# Patient Record
Sex: Female | Born: 1950 | Race: Black or African American | Hispanic: No | Marital: Single | Smoking: Former smoker
Health system: Southern US, Community
[De-identification: ages and names within clinical notes are randomized; demographics above are authoritative.]

## PROBLEM LIST (undated history)

## (undated) DIAGNOSIS — C801 Malignant (primary) neoplasm, unspecified: Secondary | ICD-10-CM

## (undated) DIAGNOSIS — Z803 Family history of malignant neoplasm of breast: Secondary | ICD-10-CM

## (undated) DIAGNOSIS — K625 Hemorrhage of anus and rectum: Secondary | ICD-10-CM

## (undated) DIAGNOSIS — K219 Gastro-esophageal reflux disease without esophagitis: Secondary | ICD-10-CM

## (undated) DIAGNOSIS — A630 Anogenital (venereal) warts: Secondary | ICD-10-CM

## (undated) DIAGNOSIS — Z800 Family history of malignant neoplasm of digestive organs: Secondary | ICD-10-CM

## (undated) DIAGNOSIS — I10 Essential (primary) hypertension: Secondary | ICD-10-CM

## (undated) DIAGNOSIS — R61 Generalized hyperhidrosis: Secondary | ICD-10-CM

## (undated) HISTORY — DX: Generalized hyperhidrosis: R61

## (undated) HISTORY — DX: Gastro-esophageal reflux disease without esophagitis: K21.9

## (undated) HISTORY — DX: Hemorrhage of anus and rectum: K62.5

## (undated) HISTORY — PX: OSTOMY TAKE DOWN: SHX2142

## (undated) HISTORY — PX: PORT-A-CATH REMOVAL: SHX5289

## (undated) HISTORY — DX: Anogenital (venereal) warts: A63.0

## (undated) HISTORY — DX: Family history of malignant neoplasm of digestive organs: Z80.0

## (undated) HISTORY — PX: RECTAL SURGERY: SHX760

## (undated) HISTORY — DX: Family history of malignant neoplasm of breast: Z80.3

## (undated) HISTORY — PX: LIVER SURGERY: SHX698

## (undated) HISTORY — DX: Malignant (primary) neoplasm, unspecified: C80.1

---

## 2003-03-14 DIAGNOSIS — C801 Malignant (primary) neoplasm, unspecified: Secondary | ICD-10-CM

## 2003-03-14 HISTORY — DX: Malignant (primary) neoplasm, unspecified: C80.1

## 2004-02-16 ENCOUNTER — Ambulatory Visit (HOSPITAL_COMMUNITY): Admission: RE | Admit: 2004-02-16 | Discharge: 2004-02-16 | Payer: Self-pay | Admitting: Obstetrics

## 2004-05-19 ENCOUNTER — Ambulatory Visit: Payer: Self-pay | Admitting: Cardiovascular Disease

## 2004-05-19 ENCOUNTER — Ambulatory Visit: Payer: Self-pay | Admitting: Critical Care Medicine

## 2004-05-19 ENCOUNTER — Inpatient Hospital Stay (HOSPITAL_COMMUNITY): Admission: EM | Admit: 2004-05-19 | Discharge: 2004-05-31 | Payer: Self-pay | Admitting: Family Medicine

## 2004-05-19 ENCOUNTER — Ambulatory Visit: Payer: Self-pay | Admitting: Infectious Diseases

## 2004-05-20 ENCOUNTER — Encounter (INDEPENDENT_AMBULATORY_CARE_PROVIDER_SITE_OTHER): Payer: Self-pay | Admitting: Specialist

## 2004-05-20 ENCOUNTER — Encounter: Payer: Self-pay | Admitting: Cardiology

## 2004-05-24 ENCOUNTER — Encounter (INDEPENDENT_AMBULATORY_CARE_PROVIDER_SITE_OTHER): Payer: Self-pay | Admitting: Specialist

## 2004-05-24 ENCOUNTER — Encounter (INDEPENDENT_AMBULATORY_CARE_PROVIDER_SITE_OTHER): Payer: Self-pay | Admitting: *Deleted

## 2004-06-08 ENCOUNTER — Encounter: Admission: RE | Admit: 2004-06-08 | Discharge: 2004-06-08 | Payer: Self-pay | Admitting: Thoracic Surgery

## 2004-06-17 ENCOUNTER — Ambulatory Visit: Payer: Self-pay | Admitting: Infectious Diseases

## 2004-08-10 ENCOUNTER — Encounter: Admission: RE | Admit: 2004-08-10 | Discharge: 2004-08-10 | Payer: Self-pay | Admitting: Thoracic Surgery

## 2004-09-16 ENCOUNTER — Ambulatory Visit: Payer: Self-pay | Admitting: Infectious Diseases

## 2005-12-25 ENCOUNTER — Ambulatory Visit (HOSPITAL_COMMUNITY): Admission: RE | Admit: 2005-12-25 | Discharge: 2005-12-25 | Payer: Self-pay | Admitting: Gastroenterology

## 2005-12-25 ENCOUNTER — Encounter (INDEPENDENT_AMBULATORY_CARE_PROVIDER_SITE_OTHER): Payer: Self-pay | Admitting: *Deleted

## 2005-12-26 ENCOUNTER — Ambulatory Visit (HOSPITAL_COMMUNITY): Admission: RE | Admit: 2005-12-26 | Discharge: 2005-12-26 | Payer: Self-pay | Admitting: Gastroenterology

## 2006-01-05 ENCOUNTER — Ambulatory Visit: Payer: Self-pay | Admitting: Hematology and Oncology

## 2006-01-18 LAB — CEA: CEA: 4.7 ng/mL (ref 0.0–5.0)

## 2006-01-18 LAB — CBC WITH DIFFERENTIAL/PLATELET
Eosinophils Absolute: 0.4 10*3/uL (ref 0.0–0.5)
LYMPH%: 15.4 % (ref 14.0–48.0)
MCV: 92.6 fL (ref 81.0–101.0)
MONO%: 5.4 % (ref 0.0–13.0)
NEUT#: 4.2 10*3/uL (ref 1.5–6.5)
Platelets: 248 10*3/uL (ref 145–400)
RBC: 4.41 10*6/uL (ref 3.70–5.32)

## 2006-01-18 LAB — COMPREHENSIVE METABOLIC PANEL
ALT: 11 U/L (ref 0–35)
BUN: 6 mg/dL (ref 6–23)
CO2: 26 mEq/L (ref 19–32)
Creatinine, Ser: 0.76 mg/dL (ref 0.40–1.20)
Glucose, Bld: 81 mg/dL (ref 70–99)
Total Bilirubin: 0.7 mg/dL (ref 0.3–1.2)

## 2006-01-19 LAB — HIV ANTIBODY (ROUTINE TESTING W REFLEX)

## 2006-01-22 ENCOUNTER — Ambulatory Visit: Admission: RE | Admit: 2006-01-22 | Discharge: 2006-04-05 | Payer: Self-pay | Admitting: Radiation Oncology

## 2006-01-23 ENCOUNTER — Ambulatory Visit (HOSPITAL_COMMUNITY): Admission: RE | Admit: 2006-01-23 | Discharge: 2006-01-23 | Payer: Self-pay | Admitting: Hematology and Oncology

## 2006-02-06 LAB — CBC WITH DIFFERENTIAL/PLATELET
Basophils Absolute: 0.1 10*3/uL (ref 0.0–0.1)
Eosinophils Absolute: 0.6 10*3/uL — ABNORMAL HIGH (ref 0.0–0.5)
HCT: 39.9 % (ref 34.8–46.6)
HGB: 13.7 g/dL (ref 11.6–15.9)
MONO#: 0.4 10*3/uL (ref 0.1–0.9)
NEUT#: 3.6 10*3/uL (ref 1.5–6.5)
NEUT%: 63.6 % (ref 39.6–76.8)
WBC: 5.6 10*3/uL (ref 3.9–10.0)
lymph#: 1 10*3/uL (ref 0.9–3.3)

## 2006-02-08 ENCOUNTER — Ambulatory Visit (HOSPITAL_COMMUNITY): Admission: RE | Admit: 2006-02-08 | Discharge: 2006-02-08 | Payer: Self-pay | Admitting: General Surgery

## 2006-02-12 LAB — CBC WITH DIFFERENTIAL/PLATELET
Basophils Absolute: 0.1 10*3/uL (ref 0.0–0.1)
Eosinophils Absolute: 0.6 10*3/uL — ABNORMAL HIGH (ref 0.0–0.5)
HCT: 38.1 % (ref 34.8–46.6)
HGB: 13.4 g/dL (ref 11.6–15.9)
LYMPH%: 15.4 % (ref 14.0–48.0)
MONO#: 0.6 10*3/uL (ref 0.1–0.9)
NEUT#: 4.6 10*3/uL (ref 1.5–6.5)
NEUT%: 67 % (ref 39.6–76.8)
Platelets: 207 10*3/uL (ref 145–400)
RBC: 4.22 10*6/uL (ref 3.70–5.32)
WBC: 6.9 10*3/uL (ref 3.9–10.0)

## 2006-02-15 LAB — CBC WITH DIFFERENTIAL/PLATELET
BASO%: 0.1 % (ref 0.0–2.0)
HCT: 41 % (ref 34.8–46.6)
LYMPH%: 6.2 % — ABNORMAL LOW (ref 14.0–48.0)
MCHC: 34.6 g/dL (ref 32.0–36.0)
MONO#: 0.1 10*3/uL (ref 0.1–0.9)
NEUT%: 89.5 % — ABNORMAL HIGH (ref 39.6–76.8)
Platelets: 236 10*3/uL (ref 145–400)
WBC: 6.5 10*3/uL (ref 3.9–10.0)

## 2006-02-19 LAB — CBC WITH DIFFERENTIAL/PLATELET
BASO%: 1.1 % (ref 0.0–2.0)
EOS%: 2.8 % (ref 0.0–7.0)
HCT: 37.7 % (ref 34.8–46.6)
LYMPH%: 9.3 % — ABNORMAL LOW (ref 14.0–48.0)
MCH: 30.6 pg (ref 26.0–34.0)
MCHC: 33.7 g/dL (ref 32.0–36.0)
NEUT%: 80.1 % — ABNORMAL HIGH (ref 39.6–76.8)
Platelets: 250 10*3/uL (ref 145–400)
lymph#: 0.4 10*3/uL — ABNORMAL LOW (ref 0.9–3.3)

## 2006-02-20 LAB — CBC WITH DIFFERENTIAL/PLATELET
BASO%: 1.1 % (ref 0.0–2.0)
EOS%: 2.5 % (ref 0.0–7.0)
MCH: 31.4 pg (ref 26.0–34.0)
MCHC: 34.3 g/dL (ref 32.0–36.0)
RBC: 4.26 10*6/uL (ref 3.70–5.32)
RDW: 10.5 % — ABNORMAL LOW (ref 11.3–14.5)
lymph#: 0.3 10*3/uL — ABNORMAL LOW (ref 0.9–3.3)

## 2006-02-21 ENCOUNTER — Ambulatory Visit: Payer: Self-pay | Admitting: Hematology and Oncology

## 2006-02-26 LAB — CBC WITH DIFFERENTIAL/PLATELET
Basophils Absolute: 0 10*3/uL (ref 0.0–0.1)
Eosinophils Absolute: 0.2 10*3/uL (ref 0.0–0.5)
HCT: 38 % (ref 34.8–46.6)
HGB: 13.6 g/dL (ref 11.6–15.9)
LYMPH%: 6.6 % — ABNORMAL LOW (ref 14.0–48.0)
MCV: 89.6 fL (ref 81.0–101.0)
MONO%: 4 % (ref 0.0–13.0)
NEUT#: 4.4 10*3/uL (ref 1.5–6.5)
Platelets: 217 10*3/uL (ref 145–400)

## 2006-03-02 ENCOUNTER — Ambulatory Visit (HOSPITAL_COMMUNITY): Admission: RE | Admit: 2006-03-02 | Discharge: 2006-03-02 | Payer: Self-pay | Admitting: Hematology and Oncology

## 2006-03-05 LAB — URINALYSIS, MICROSCOPIC - CHCC
Glucose: NEGATIVE g/dL
Nitrite: NEGATIVE
RBC count: NEGATIVE (ref 0–2)
Specific Gravity, Urine: 1.005 (ref 1.003–1.035)

## 2006-03-05 LAB — BASIC METABOLIC PANEL
Calcium: 8.8 mg/dL (ref 8.4–10.5)
Creatinine, Ser: 0.8 mg/dL (ref 0.40–1.20)
Glucose, Bld: 103 mg/dL — ABNORMAL HIGH (ref 70–99)
Sodium: 139 mEq/L (ref 135–145)

## 2006-03-05 LAB — CBC WITH DIFFERENTIAL/PLATELET
Eosinophils Absolute: 0.2 10*3/uL (ref 0.0–0.5)
LYMPH%: 4.8 % — ABNORMAL LOW (ref 14.0–48.0)
MCHC: 36 g/dL (ref 32.0–36.0)
MCV: 89.6 fL (ref 81.0–101.0)
MONO%: 8.4 % (ref 0.0–13.0)
Platelets: 217 10*3/uL (ref 145–400)
RBC: 3.96 10*6/uL (ref 3.70–5.32)

## 2006-03-12 LAB — BASIC METABOLIC PANEL
BUN: 11 mg/dL (ref 6–23)
Calcium: 9 mg/dL (ref 8.4–10.5)
Creatinine, Ser: 0.74 mg/dL (ref 0.40–1.20)
Glucose, Bld: 106 mg/dL — ABNORMAL HIGH (ref 70–99)
Sodium: 144 mEq/L (ref 135–145)

## 2006-03-19 LAB — CBC WITH DIFFERENTIAL/PLATELET
Basophils Absolute: 0 10*3/uL (ref 0.0–0.1)
Eosinophils Absolute: 0.1 10*3/uL (ref 0.0–0.5)
HCT: 32.5 % — ABNORMAL LOW (ref 34.8–46.6)
HGB: 12 g/dL (ref 11.6–15.9)
LYMPH%: 1.4 % — ABNORMAL LOW (ref 14.0–48.0)
MCV: 92 fL (ref 81.0–101.0)
MONO#: 0.7 10*3/uL (ref 0.1–0.9)
MONO%: 9.9 % (ref 0.0–13.0)
NEUT#: 5.8 10*3/uL (ref 1.5–6.5)
NEUT%: 87.1 % — ABNORMAL HIGH (ref 39.6–76.8)
Platelets: 276 10*3/uL (ref 145–400)
RBC: 3.53 10*6/uL — ABNORMAL LOW (ref 3.70–5.32)

## 2006-03-20 LAB — URINALYSIS, MICROSCOPIC - CHCC
Nitrite: NEGATIVE
Protein: 30 mg/dL
Specific Gravity, Urine: 1.015 (ref 1.003–1.035)
pH: 6.5 (ref 4.6–8.0)

## 2006-03-22 LAB — CBC WITH DIFFERENTIAL/PLATELET
Basophils Absolute: 0 10*3/uL (ref 0.0–0.1)
Eosinophils Absolute: 0.1 10*3/uL (ref 0.0–0.5)
HGB: 11.8 g/dL (ref 11.6–15.9)
LYMPH%: 1.5 % — ABNORMAL LOW (ref 14.0–48.0)
MCV: 92.2 fL (ref 81.0–101.0)
MONO#: 0.4 10*3/uL (ref 0.1–0.9)
MONO%: 9.3 % (ref 0.0–13.0)
NEUT#: 3.8 10*3/uL (ref 1.5–6.5)
Platelets: 308 10*3/uL (ref 145–400)
RBC: 3.5 10*6/uL — ABNORMAL LOW (ref 3.70–5.32)
WBC: 4.5 10*3/uL (ref 3.9–10.0)

## 2006-03-22 LAB — COMPREHENSIVE METABOLIC PANEL
Albumin: 4.3 g/dL (ref 3.5–5.2)
Alkaline Phosphatase: 66 U/L (ref 39–117)
BUN: 8 mg/dL (ref 6–23)
CO2: 25 mEq/L (ref 19–32)
Glucose, Bld: 93 mg/dL (ref 70–99)
Potassium: 3.4 mEq/L — ABNORMAL LOW (ref 3.5–5.3)
Sodium: 142 mEq/L (ref 135–145)
Total Bilirubin: 0.7 mg/dL (ref 0.3–1.2)
Total Protein: 7.3 g/dL (ref 6.0–8.3)

## 2006-05-02 ENCOUNTER — Inpatient Hospital Stay (HOSPITAL_COMMUNITY): Admission: RE | Admit: 2006-05-02 | Discharge: 2006-05-09 | Payer: Self-pay | Admitting: General Surgery

## 2006-05-02 ENCOUNTER — Encounter (INDEPENDENT_AMBULATORY_CARE_PROVIDER_SITE_OTHER): Payer: Self-pay | Admitting: Specialist

## 2006-05-14 ENCOUNTER — Ambulatory Visit: Admission: RE | Admit: 2006-05-14 | Discharge: 2006-05-14 | Payer: Self-pay | Admitting: General Surgery

## 2006-05-17 ENCOUNTER — Encounter (HOSPITAL_COMMUNITY): Admission: RE | Admit: 2006-05-17 | Discharge: 2006-05-17 | Payer: Self-pay | Admitting: General Surgery

## 2006-05-31 IMAGING — CR DG CHEST 2V
2 series · 2 of 2 positions shown · non-contrast
Comparison: none

CLINICAL DATA: Short of breath.   Cough.
 CHEST - 2 VIEW: 
 No comparison. 
 There is a moderately large right pleural effusion occupying one/half of the right hemithorax.  There is compressive atelectasis in the right lower lobe.  There is no midline shift.  There is no heart failure.  The left lung is clear and there is no effusion on the left.

[view not recorded (1 of 2)]
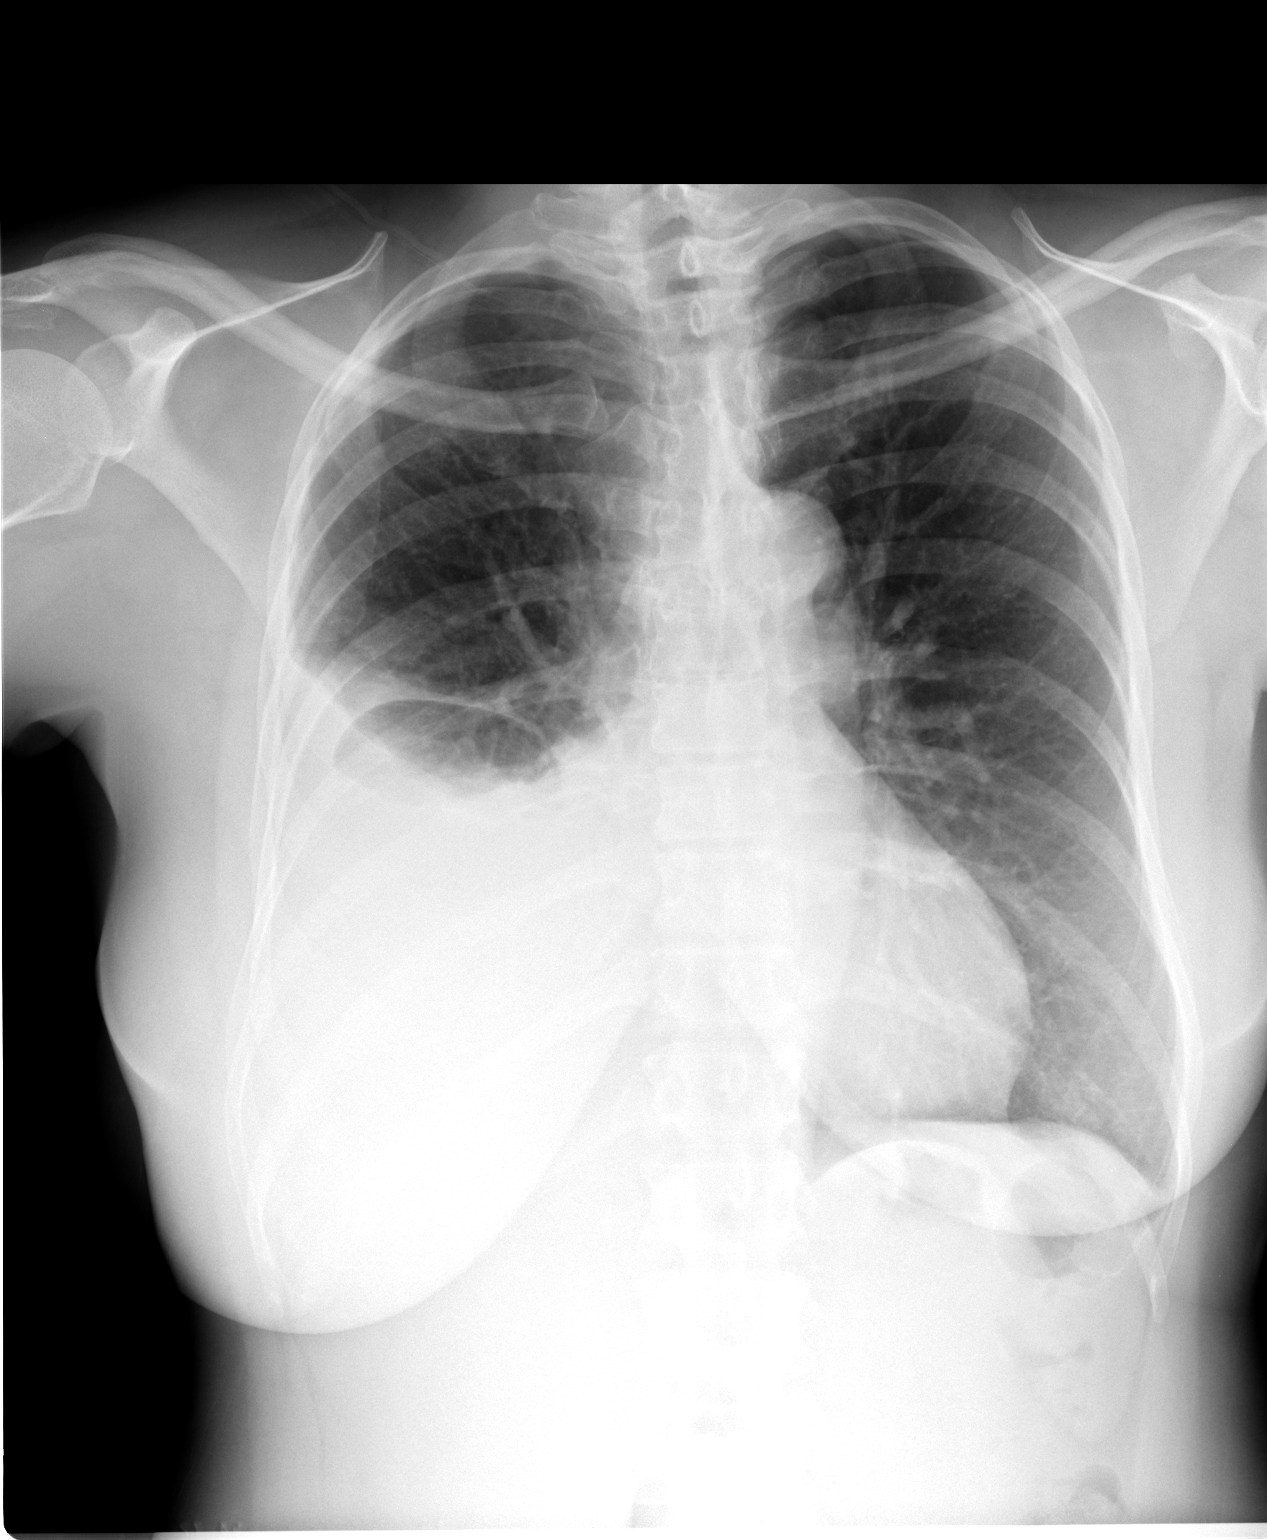

[view not recorded (2 of 2)]
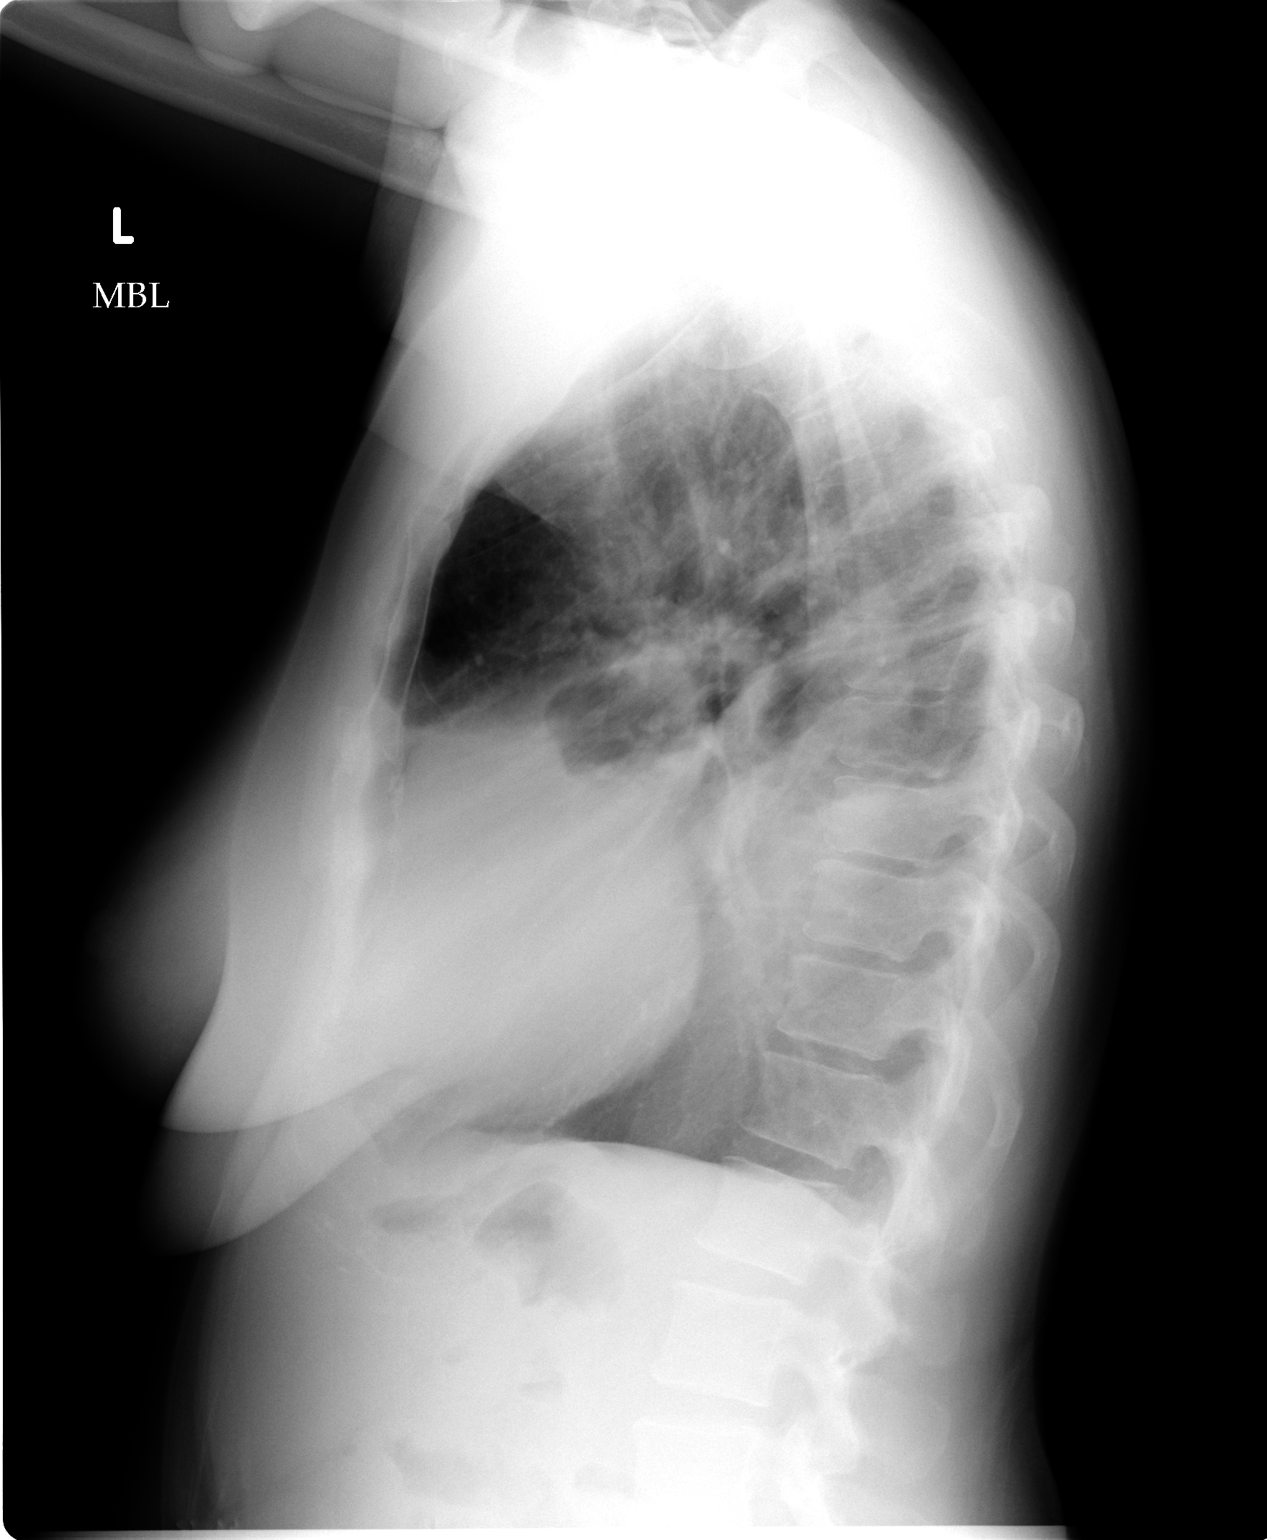

[2 of 2 positions shown; findings below may reference images not displayed]

IMPRESSION: Moderately large right pleural effusion.  No prior studies.  This is concerning for infection or neoplasm and thoracentesis is suggested.

## 2006-06-01 IMAGING — CT CT PELVIS W/ CM
3 series · 15 of 32 positions shown, 18 images · IV contrast (agent unspecified)
Comparison: None.

CLINICAL DATA: Patient having right chest pain for two weeks.  Shortness of breath.  Question infection versus neoplasm.  History of breast cancer.
TECHNIQUE: Contiguous 5 mm axial images of the chest, abdomen and pelvis were obtained after administration of oral and 100 cc of nonionic intravenous contrast.  
CT CHEST WITH CONTRAST:
Review of the patient?s chest x-ray from 05/19/04 demonstrates a unilateral right effusion.  
CT scanning today confirms a moderate to large free flowing right pleural effusion.  This is associated with right middle and lower lobe collapse.  Enhancing tissue is seen along the anterior pleura near the base and a 9 X 15 mm anterior juxtadiaphragmatic lymph node is noted.  There is no axillary, mediastinal or left hilar lymphadenopathy.  Central left lung collapse near the hilum could obscure right hilar adenopathy.
The heart size is normal without pericardial effusion.  There is no left pleural effusion.  Lung windows demonstrate right middle and lower lobe collapse.  There is central alveolar disease in the right upper lobe.  The left lung is clear.

[Series 2: chest/abd/pelvis · axial · 0.79mm/px · z∈[-531,-102]mm · 8 of 155 slices shown]
[im 17/155  soft-tissue]
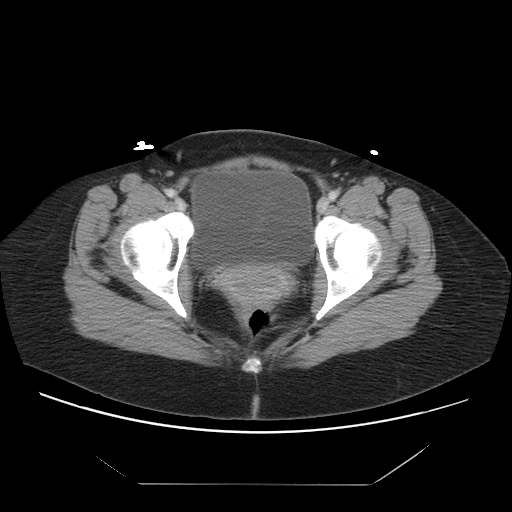
[im 33/155  soft-tissue]
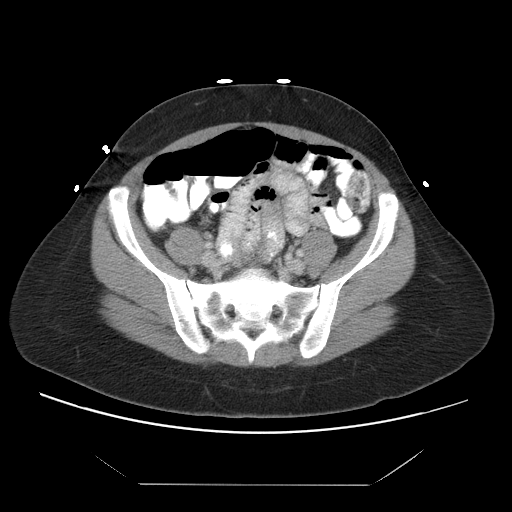
[im 49/155  soft-tissue]
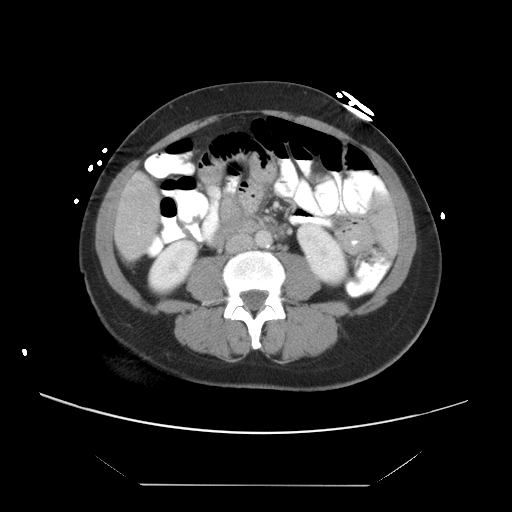
[im 65/155  soft-tissue]
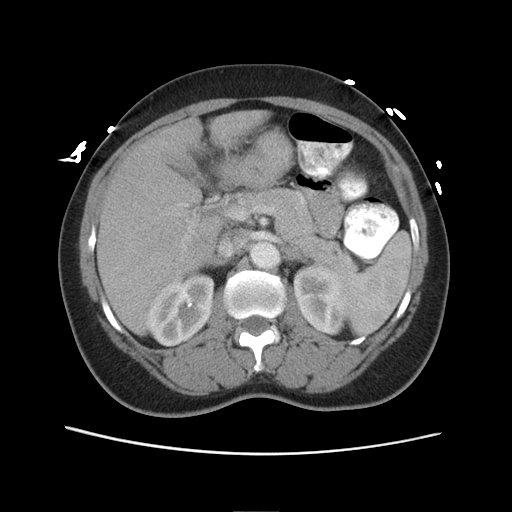
[im 90/155  soft-tissue]
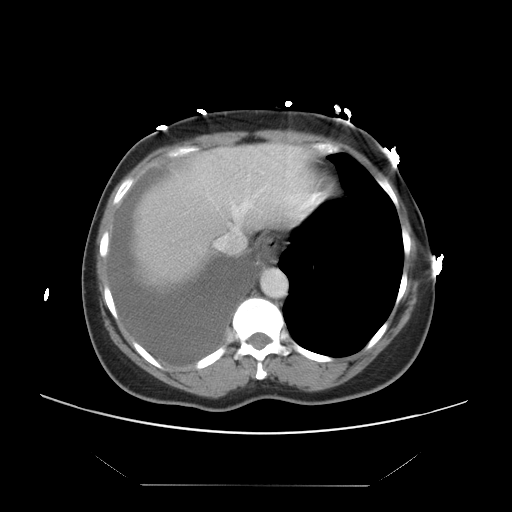
[im 106/155  soft-tissue]
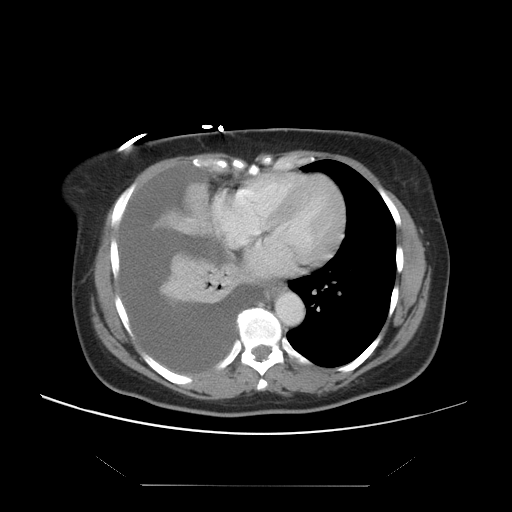
[im 122/155  soft-tissue]
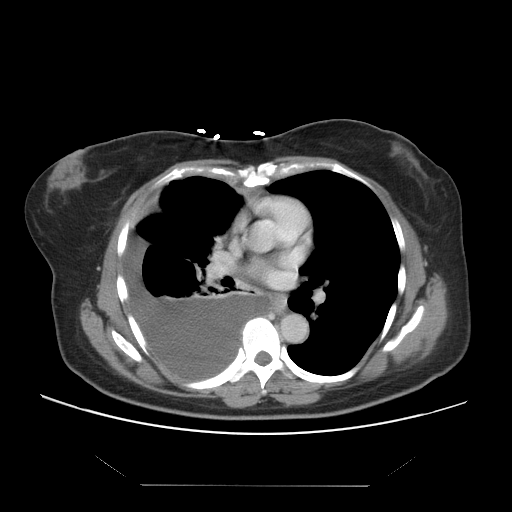
[im 138/155  soft-tissue]
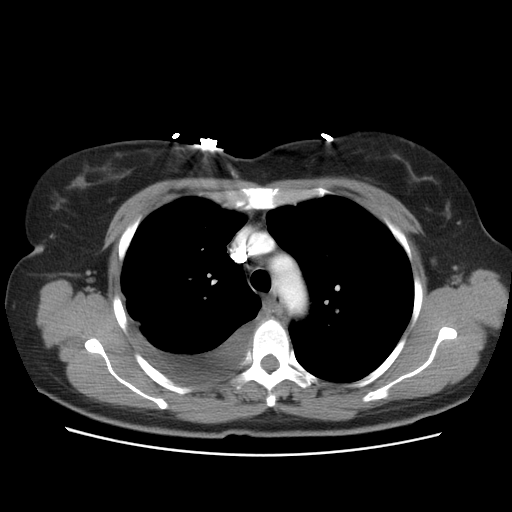

[Series 3: recon 2: chest/abd/pelvis · axial · 0.70mm/px · z∈[-272,-102]mm · 5 of 60 slices shown]
[im 9/60  soft-tissue]
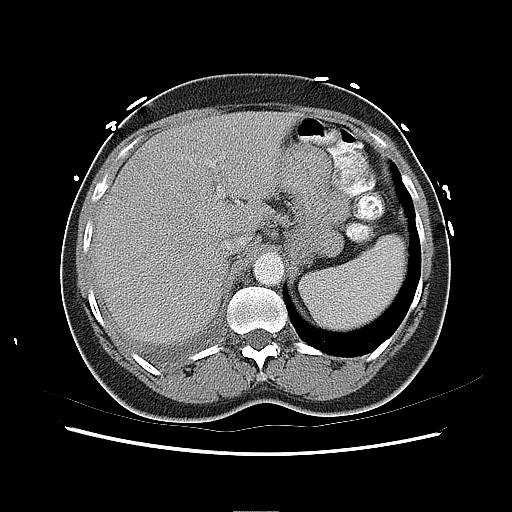
[im 17/60  soft-tissue]
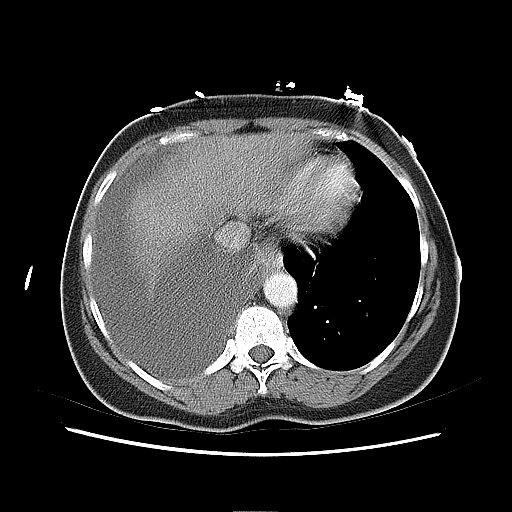
[im 26/60  soft-tissue]
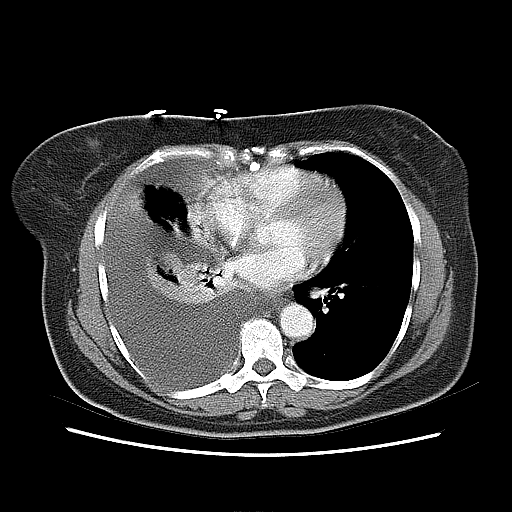
[im 34/60  soft-tissue]
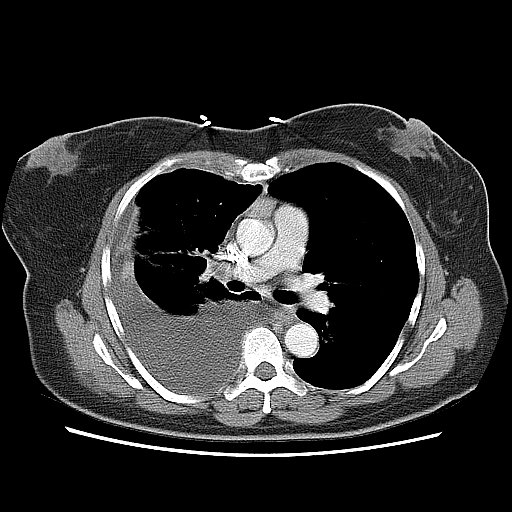
[im 43/60  soft-tissue]
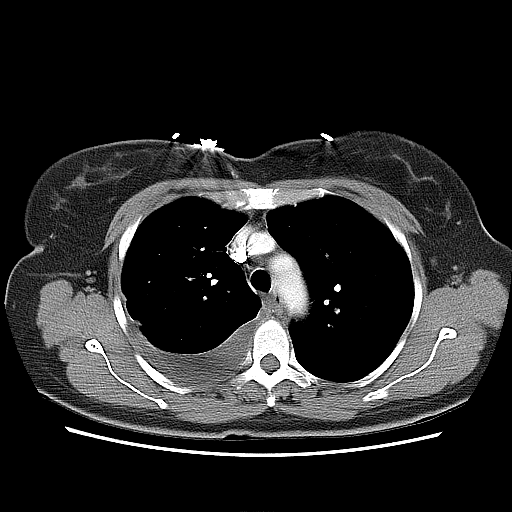

[Series 4: renal delays · axial · 0.70mm/px · z∈[-396,-341]mm · 2 of 35 slices shown, 5 images]
[im 12/35  soft-tissue]
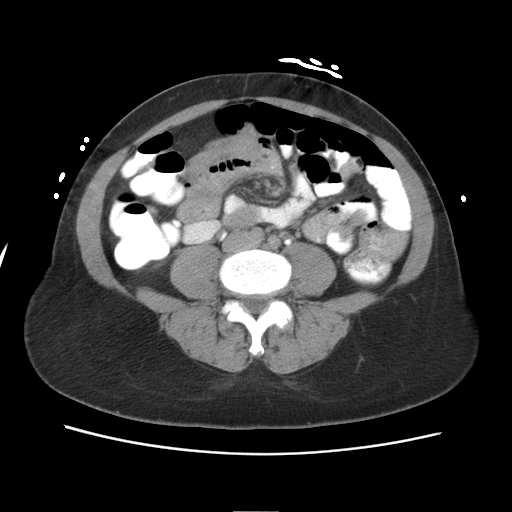
[im 12/35  lung]
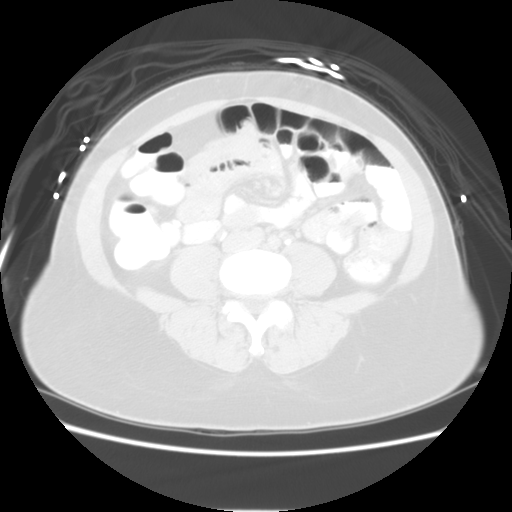
[im 12/35  bone]
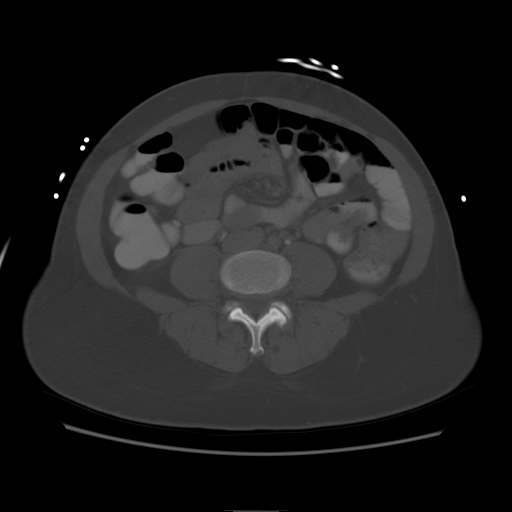
[im 23/35  soft-tissue]
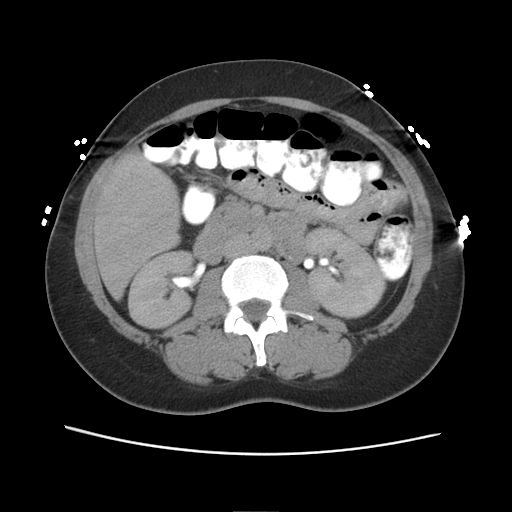
[im 23/35  lung]
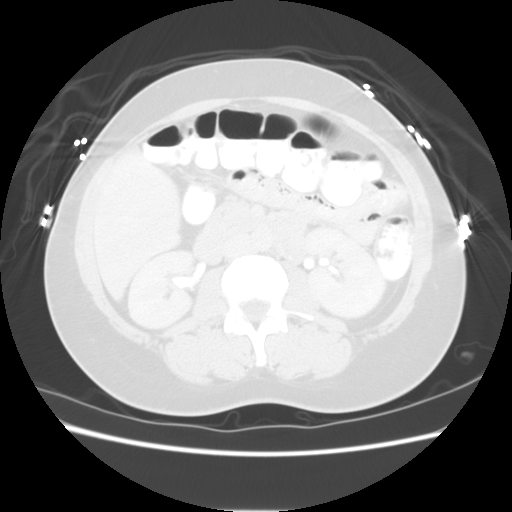

[15 of 32 positions shown; findings below may reference images not displayed]

IMPRESSION: 1.  Moderate to large dependent right pleural effusion with associated right middle and lower lobe collapse/consolidation.  Unilateral involvement is concerning for neoplasm. 
2.  Central alveolar disease in the right upper lobe may be post infectious or inflammatory but neoplastic involvement is also a consideration.  
ABDOMEN CT SCAN WITH CONTRAST:
No focal abnormality is seen in the liver or spleen.  The stomach, duodenum, pancreas and right adrenal gland are unremarkable.  There is a punctate calcification in the left adrenal gland.  A tiny fluid density focus is seen adjacent to the gallbladder fundus which may represent an unusual gallbladder fold.  Although unusual, gallbladder diverticulum would be a consideration.  There is no associated inflammatory change.
The right kidney has normal features.  Two adjacent small low density lesions in the left kidney cannot be completely characterized but are likely cysts.
IMPRESSION: No acute findings in the abdomen.  No definite evidence for neoplastic involvement.  
PELVIS CT WITH CONTRAST:
No free fluid.  No evidence for lymphadenopathy.  Probable exophytic fibroid from the left uterine fundus.  No adrenal masses.
IMPRESSION: No acute findings in the anatomic pelvis.  There is no evidence for neoplastic involvement.

## 2006-06-01 IMAGING — CR DG CHEST 1V
1 series · 1 of 1 positions shown · non-contrast
Comparison: PA and lateral chest 05/19/04.

CLINICAL DATA: Status post right thoracentesis.
 CHEST ? 1 VIEW:

[view not recorded]
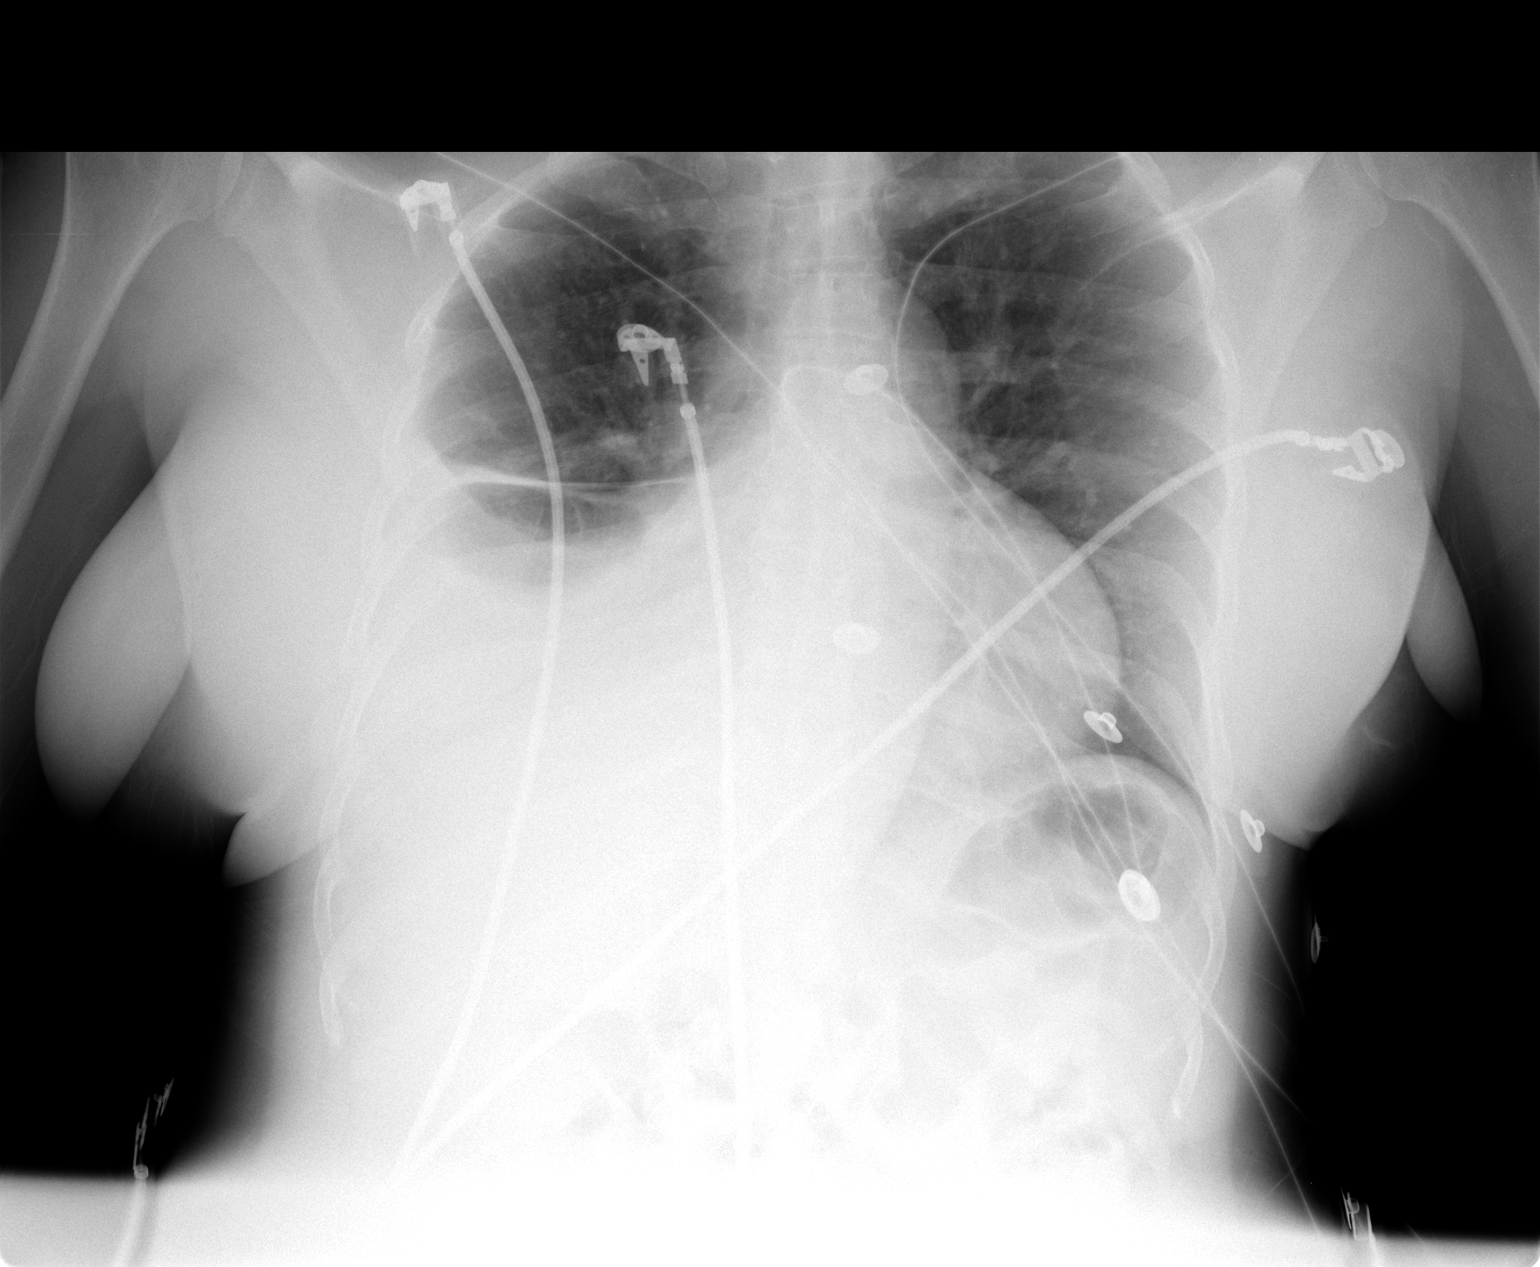

[1 of 1 positions shown; findings below may reference images not displayed]

FINDINGS: Again seen is right pleural effusion which does not appear markedly changed.  No pneumothorax is identified.  Left lung remains clear.
IMPRESSION: Status post right thoracentesis.  Negative for pneumothorax.  No other change.

## 2006-06-04 ENCOUNTER — Ambulatory Visit: Payer: Self-pay | Admitting: Hematology and Oncology

## 2006-06-06 LAB — CBC WITH DIFFERENTIAL/PLATELET
BASO%: 0.2 % (ref 0.0–2.0)
Basophils Absolute: 0 10*3/uL (ref 0.0–0.1)
EOS%: 10.8 % — ABNORMAL HIGH (ref 0.0–7.0)
Eosinophils Absolute: 0.5 10*3/uL (ref 0.0–0.5)
HCT: 38.2 % (ref 34.8–46.6)
HGB: 13.4 g/dL (ref 11.6–15.9)
LYMPH%: 5.4 % — ABNORMAL LOW (ref 14.0–48.0)
MCH: 32.3 pg (ref 26.0–34.0)
MCHC: 35 g/dL (ref 32.0–36.0)
MCV: 92.2 fL (ref 81.0–101.0)
MONO#: 0.4 10*3/uL (ref 0.1–0.9)
MONO%: 7.6 % (ref 0.0–13.0)
NEUT#: 3.8 10*3/uL (ref 1.5–6.5)
NEUT%: 76 % (ref 39.6–76.8)
Platelets: 295 10*3/uL (ref 145–400)
RBC: 4.15 10*6/uL (ref 3.70–5.32)
RDW: 13.4 % (ref 11.3–14.5)
WBC: 5 10*3/uL (ref 3.9–10.0)
lymph#: 0.3 10*3/uL — ABNORMAL LOW (ref 0.9–3.3)

## 2006-06-06 LAB — COMPREHENSIVE METABOLIC PANEL
AST: 17 U/L (ref 0–37)
Albumin: 4.2 g/dL (ref 3.5–5.2)
Alkaline Phosphatase: 103 U/L (ref 39–117)
BUN: 23 mg/dL (ref 6–23)
Potassium: 4 mEq/L (ref 3.5–5.3)
Sodium: 137 mEq/L (ref 135–145)
Total Bilirubin: 0.4 mg/dL (ref 0.3–1.2)

## 2006-06-07 ENCOUNTER — Ambulatory Visit (HOSPITAL_COMMUNITY): Admission: RE | Admit: 2006-06-07 | Discharge: 2006-06-07 | Payer: Self-pay | Admitting: General Surgery

## 2006-07-24 ENCOUNTER — Ambulatory Visit: Payer: Self-pay | Admitting: Hematology and Oncology

## 2006-07-26 LAB — COMPREHENSIVE METABOLIC PANEL
BUN: 16 mg/dL (ref 6–23)
CO2: 19 mEq/L (ref 19–32)
Calcium: 9.5 mg/dL (ref 8.4–10.5)
Creatinine, Ser: 1.32 mg/dL — ABNORMAL HIGH (ref 0.40–1.20)
Glucose, Bld: 185 mg/dL — ABNORMAL HIGH (ref 70–99)
Total Bilirubin: 0.6 mg/dL (ref 0.3–1.2)

## 2006-07-26 LAB — CBC WITH DIFFERENTIAL/PLATELET
Basophils Absolute: 0 10*3/uL (ref 0.0–0.1)
Eosinophils Absolute: 0.4 10*3/uL (ref 0.0–0.5)
HCT: 32 % — ABNORMAL LOW (ref 34.8–46.6)
LYMPH%: 6.4 % — ABNORMAL LOW (ref 14.0–48.0)
MCV: 88.8 fL (ref 81.0–101.0)
MONO%: 6.6 % (ref 0.0–13.0)
NEUT#: 2.9 10*3/uL (ref 1.5–6.5)
NEUT%: 77.3 % — ABNORMAL HIGH (ref 39.6–76.8)
Platelets: 279 10*3/uL (ref 145–400)
RBC: 3.61 10*6/uL — ABNORMAL LOW (ref 3.70–5.32)

## 2006-07-26 LAB — CEA: CEA: 2.2 ng/mL (ref 0.0–5.0)

## 2006-08-02 ENCOUNTER — Ambulatory Visit (HOSPITAL_COMMUNITY): Admission: RE | Admit: 2006-08-02 | Discharge: 2006-08-02 | Payer: Self-pay | Admitting: Oncology

## 2006-08-21 LAB — COMPREHENSIVE METABOLIC PANEL
AST: 21 U/L (ref 0–37)
Albumin: 4.4 g/dL (ref 3.5–5.2)
Alkaline Phosphatase: 126 U/L — ABNORMAL HIGH (ref 39–117)
BUN: 20 mg/dL (ref 6–23)
Potassium: 3.8 mEq/L (ref 3.5–5.3)
Sodium: 134 mEq/L — ABNORMAL LOW (ref 135–145)
Total Bilirubin: 0.8 mg/dL (ref 0.3–1.2)

## 2006-08-21 LAB — CBC WITH DIFFERENTIAL/PLATELET
EOS%: 1.4 % (ref 0.0–7.0)
MCH: 32.1 pg (ref 26.0–34.0)
MCV: 90.7 fL (ref 81.0–101.0)
MONO%: 7 % (ref 0.0–13.0)
RBC: 3.88 10*6/uL (ref 3.70–5.32)
RDW: 17.9 % — ABNORMAL HIGH (ref 11.3–14.5)

## 2006-08-29 LAB — COMPREHENSIVE METABOLIC PANEL
Alkaline Phosphatase: 100 U/L (ref 39–117)
BUN: 40 mg/dL — ABNORMAL HIGH (ref 6–23)
CO2: 17 mEq/L — ABNORMAL LOW (ref 19–32)
Creatinine, Ser: 2.29 mg/dL — ABNORMAL HIGH (ref 0.40–1.20)
Glucose, Bld: 90 mg/dL (ref 70–99)
Sodium: 134 mEq/L — ABNORMAL LOW (ref 135–145)
Total Bilirubin: 0.7 mg/dL (ref 0.3–1.2)

## 2006-08-29 LAB — CBC WITH DIFFERENTIAL/PLATELET
Basophils Absolute: 0 10*3/uL (ref 0.0–0.1)
Eosinophils Absolute: 0.1 10*3/uL (ref 0.0–0.5)
HCT: 35.9 % (ref 34.8–46.6)
LYMPH%: 9.8 % — ABNORMAL LOW (ref 14.0–48.0)
MCV: 92 fL (ref 81.0–101.0)
MONO%: 5.1 % (ref 0.0–13.0)
NEUT#: 2.1 10*3/uL (ref 1.5–6.5)
NEUT%: 82.2 % — ABNORMAL HIGH (ref 39.6–76.8)
Platelets: 176 10*3/uL (ref 145–400)
RBC: 3.91 10*6/uL (ref 3.70–5.32)

## 2006-09-04 ENCOUNTER — Ambulatory Visit: Payer: Self-pay | Admitting: Hematology and Oncology

## 2006-09-04 ENCOUNTER — Ambulatory Visit (HOSPITAL_COMMUNITY): Admission: RE | Admit: 2006-09-04 | Discharge: 2006-09-04 | Payer: Self-pay | Admitting: Oncology

## 2006-09-04 LAB — CBC WITH DIFFERENTIAL/PLATELET
Basophils Absolute: 0 10*3/uL (ref 0.0–0.1)
Eosinophils Absolute: 0.1 10*3/uL (ref 0.0–0.5)
HCT: 33.8 % — ABNORMAL LOW (ref 34.8–46.6)
HGB: 12.2 g/dL (ref 11.6–15.9)
LYMPH%: 15.5 % (ref 14.0–48.0)
MCV: 91.3 fL (ref 81.0–101.0)
MONO#: 0.2 10*3/uL (ref 0.1–0.9)
MONO%: 15.8 % — ABNORMAL HIGH (ref 0.0–13.0)
NEUT#: 0.8 10*3/uL — ABNORMAL LOW (ref 1.5–6.5)
Platelets: ADEQUATE 10*3/uL (ref 145–400)
WBC: 1.3 10*3/uL — ABNORMAL LOW (ref 3.9–10.0)

## 2006-09-18 LAB — COMPREHENSIVE METABOLIC PANEL
Albumin: 4.7 g/dL (ref 3.5–5.2)
Alkaline Phosphatase: 113 U/L (ref 39–117)
BUN: 55 mg/dL — ABNORMAL HIGH (ref 6–23)
CO2: 15 mEq/L — ABNORMAL LOW (ref 19–32)
Glucose, Bld: 102 mg/dL — ABNORMAL HIGH (ref 70–99)
Total Bilirubin: 0.5 mg/dL (ref 0.3–1.2)

## 2006-09-18 LAB — CBC WITH DIFFERENTIAL/PLATELET
Basophils Absolute: 0.1 10*3/uL (ref 0.0–0.1)
Eosinophils Absolute: 0 10*3/uL (ref 0.0–0.5)
HCT: 42.9 % (ref 34.8–46.6)
HGB: 15.4 g/dL (ref 11.6–15.9)
LYMPH%: 5.3 % — ABNORMAL LOW (ref 14.0–48.0)
MCV: 92.6 fL (ref 81.0–101.0)
MONO#: 0.3 10*3/uL (ref 0.1–0.9)
MONO%: 4.2 % (ref 0.0–13.0)
NEUT#: 5.8 10*3/uL (ref 1.5–6.5)
Platelets: 174 10*3/uL (ref 145–400)

## 2006-09-18 LAB — CEA: CEA: 4.7 ng/mL (ref 0.0–5.0)

## 2006-10-02 LAB — CBC WITH DIFFERENTIAL/PLATELET
BASO%: 0.1 % (ref 0.0–2.0)
Eosinophils Absolute: 0.1 10*3/uL (ref 0.0–0.5)
HCT: 36.3 % (ref 34.8–46.6)
LYMPH%: 4.7 % — ABNORMAL LOW (ref 14.0–48.0)
MCHC: 36.3 g/dL — ABNORMAL HIGH (ref 32.0–36.0)
MONO#: 0.2 10*3/uL (ref 0.1–0.9)
NEUT#: 3.2 10*3/uL (ref 1.5–6.5)
Platelets: 212 10*3/uL (ref 145–400)
RBC: 3.95 10*6/uL (ref 3.70–5.32)
WBC: 3.7 10*3/uL — ABNORMAL LOW (ref 3.9–10.0)
lymph#: 0.2 10*3/uL — ABNORMAL LOW (ref 0.9–3.3)

## 2006-10-02 LAB — COMPREHENSIVE METABOLIC PANEL
Albumin: 4.2 g/dL (ref 3.5–5.2)
CO2: 15 mEq/L — ABNORMAL LOW (ref 19–32)
Chloride: 92 mEq/L — ABNORMAL LOW (ref 96–112)
Glucose, Bld: 108 mg/dL — ABNORMAL HIGH (ref 70–99)
Potassium: 3.1 mEq/L — ABNORMAL LOW (ref 3.5–5.3)
Sodium: 128 mEq/L — ABNORMAL LOW (ref 135–145)
Total Protein: 7.2 g/dL (ref 6.0–8.3)

## 2006-10-02 LAB — CEA: CEA: 4.1 ng/mL (ref 0.0–5.0)

## 2006-10-10 ENCOUNTER — Ambulatory Visit: Payer: Self-pay

## 2006-10-15 ENCOUNTER — Ambulatory Visit: Payer: Self-pay | Admitting: Hematology and Oncology

## 2006-10-24 LAB — COMPREHENSIVE METABOLIC PANEL
ALT: 18 U/L (ref 0–35)
AST: 28 U/L (ref 0–37)
CO2: 19 mEq/L (ref 19–32)
Sodium: 132 mEq/L — ABNORMAL LOW (ref 135–145)
Total Bilirubin: 0.5 mg/dL (ref 0.3–1.2)
Total Protein: 7.1 g/dL (ref 6.0–8.3)

## 2006-10-24 LAB — CBC WITH DIFFERENTIAL/PLATELET
BASO%: 0.1 % (ref 0.0–2.0)
EOS%: 1.4 % (ref 0.0–7.0)
LYMPH%: 3.2 % — ABNORMAL LOW (ref 14.0–48.0)
MCH: 33.4 pg (ref 26.0–34.0)
MCHC: 35.5 g/dL (ref 32.0–36.0)
MONO#: 0.3 10*3/uL (ref 0.1–0.9)
Platelets: 213 10*3/uL (ref 145–400)
RBC: 3.69 10*6/uL — ABNORMAL LOW (ref 3.70–5.32)
WBC: 5.2 10*3/uL (ref 3.9–10.0)
lymph#: 0.2 10*3/uL — ABNORMAL LOW (ref 0.9–3.3)

## 2006-10-24 LAB — LACTATE DEHYDROGENASE: LDH: 189 U/L (ref 94–250)

## 2006-10-24 LAB — PHOSPHORUS: Phosphorus: 4.1 mg/dL (ref 2.3–4.6)

## 2006-10-24 LAB — IRON AND TIBC
%SAT: 39 % (ref 20–55)
Iron: 116 ug/dL (ref 42–145)

## 2006-10-24 LAB — VITAMIN B12: Vitamin B-12: 326 pg/mL (ref 211–911)

## 2006-10-29 ENCOUNTER — Ambulatory Visit (HOSPITAL_COMMUNITY): Admission: RE | Admit: 2006-10-29 | Discharge: 2006-10-29 | Payer: Self-pay | Admitting: Hematology and Oncology

## 2006-11-02 LAB — BASIC METABOLIC PANEL
CO2: 16 mEq/L — ABNORMAL LOW (ref 19–32)
Chloride: 109 mEq/L (ref 96–112)
Creatinine, Ser: 2.69 mg/dL — ABNORMAL HIGH (ref 0.40–1.20)
Potassium: 3.1 mEq/L — ABNORMAL LOW (ref 3.5–5.3)
Sodium: 135 mEq/L (ref 135–145)

## 2006-11-07 ENCOUNTER — Inpatient Hospital Stay (HOSPITAL_COMMUNITY): Admission: AD | Admit: 2006-11-07 | Discharge: 2006-11-11 | Payer: Self-pay | Admitting: Cardiology

## 2006-11-14 ENCOUNTER — Other Ambulatory Visit: Payer: Self-pay | Admitting: Hematology and Oncology

## 2006-11-14 LAB — COMPREHENSIVE METABOLIC PANEL
Albumin: 3.1 g/dL — ABNORMAL LOW (ref 3.5–5.2)
Alkaline Phosphatase: 50 U/L (ref 39–117)
BUN: 12 mg/dL (ref 6–23)
Calcium: 8.7 mg/dL (ref 8.4–10.5)
Chloride: 120 mEq/L — ABNORMAL HIGH (ref 96–112)
Creatinine, Ser: 1.54 mg/dL — ABNORMAL HIGH (ref 0.40–1.20)
Glucose, Bld: 81 mg/dL (ref 70–99)
Potassium: 4.3 mEq/L (ref 3.5–5.3)

## 2006-11-14 LAB — CBC WITH DIFFERENTIAL/PLATELET
Basophils Absolute: 0 10*3/uL (ref 0.0–0.1)
Eosinophils Absolute: 0.1 10*3/uL (ref 0.0–0.5)
HCT: 32.1 % — ABNORMAL LOW (ref 34.8–46.6)
HGB: 11.3 g/dL — ABNORMAL LOW (ref 11.6–15.9)
MCH: 33.6 pg (ref 26.0–34.0)
MCV: 95.1 fL (ref 81.0–101.0)
MONO%: 5.8 % (ref 0.0–13.0)
NEUT#: 3 10*3/uL (ref 1.5–6.5)
NEUT%: 84.4 % — ABNORMAL HIGH (ref 39.6–76.8)
RDW: 16.4 % — ABNORMAL HIGH (ref 11.3–14.5)

## 2006-11-29 ENCOUNTER — Ambulatory Visit: Payer: Self-pay | Admitting: Hematology and Oncology

## 2006-11-30 LAB — CBC WITH DIFFERENTIAL/PLATELET
Eosinophils Absolute: 0.1 10*3/uL (ref 0.0–0.5)
HGB: 12 g/dL (ref 11.6–15.9)
MONO#: 0.3 10*3/uL (ref 0.1–0.9)
NEUT#: 3.7 10*3/uL (ref 1.5–6.5)
RBC: 3.52 10*6/uL — ABNORMAL LOW (ref 3.70–5.32)
RDW: 15.4 % — ABNORMAL HIGH (ref 11.3–14.5)
WBC: 4.4 10*3/uL (ref 3.9–10.0)

## 2006-11-30 LAB — COMPREHENSIVE METABOLIC PANEL
Albumin: 3.9 g/dL (ref 3.5–5.2)
Alkaline Phosphatase: 81 U/L (ref 39–117)
CO2: 21 mEq/L (ref 19–32)
Calcium: 9.8 mg/dL (ref 8.4–10.5)
Chloride: 107 mEq/L (ref 96–112)
Glucose, Bld: 75 mg/dL (ref 70–99)
Potassium: 3.4 mEq/L — ABNORMAL LOW (ref 3.5–5.3)
Sodium: 137 mEq/L (ref 135–145)
Total Protein: 7.6 g/dL (ref 6.0–8.3)

## 2006-12-24 ENCOUNTER — Encounter: Admission: RE | Admit: 2006-12-24 | Discharge: 2006-12-24 | Payer: Self-pay | Admitting: General Surgery

## 2007-01-03 ENCOUNTER — Ambulatory Visit (HOSPITAL_COMMUNITY): Admission: RE | Admit: 2007-01-03 | Discharge: 2007-01-03 | Payer: Self-pay | Admitting: Hematology and Oncology

## 2007-01-03 LAB — COMPREHENSIVE METABOLIC PANEL
Albumin: 3.5 g/dL (ref 3.5–5.2)
BUN: 14 mg/dL (ref 6–23)
Calcium: 9.3 mg/dL (ref 8.4–10.5)
Chloride: 118 mEq/L — ABNORMAL HIGH (ref 96–112)
Creatinine, Ser: 1.59 mg/dL — ABNORMAL HIGH (ref 0.40–1.20)
Glucose, Bld: 86 mg/dL (ref 70–99)
Potassium: 4.1 mEq/L (ref 3.5–5.3)

## 2007-01-03 LAB — CBC WITH DIFFERENTIAL/PLATELET
Basophils Absolute: 0 10*3/uL (ref 0.0–0.1)
EOS%: 3.2 % (ref 0.0–7.0)
Eosinophils Absolute: 0.1 10*3/uL (ref 0.0–0.5)
HCT: 29.3 % — ABNORMAL LOW (ref 34.8–46.6)
HGB: 10.4 g/dL — ABNORMAL LOW (ref 11.6–15.9)
MCH: 35.3 pg — ABNORMAL HIGH (ref 26.0–34.0)
MCV: 99.4 fL (ref 81.0–101.0)
NEUT#: 2.7 10*3/uL (ref 1.5–6.5)
NEUT%: 83.9 % — ABNORMAL HIGH (ref 39.6–76.8)
RDW: 15.9 % — ABNORMAL HIGH (ref 11.3–14.5)
lymph#: 0.2 10*3/uL — ABNORMAL LOW (ref 0.9–3.3)

## 2007-01-03 LAB — CEA: CEA: 4.2 ng/mL (ref 0.0–5.0)

## 2007-01-24 ENCOUNTER — Ambulatory Visit: Payer: Self-pay | Admitting: Hematology and Oncology

## 2007-01-28 LAB — COMPREHENSIVE METABOLIC PANEL
Albumin: 4.4 g/dL (ref 3.5–5.2)
Alkaline Phosphatase: 96 U/L (ref 39–117)
Calcium: 8.8 mg/dL (ref 8.4–10.5)
Chloride: 115 mEq/L — ABNORMAL HIGH (ref 96–112)
Glucose, Bld: 74 mg/dL (ref 70–99)
Potassium: 4.3 mEq/L (ref 3.5–5.3)
Sodium: 138 mEq/L (ref 135–145)
Total Protein: 7.6 g/dL (ref 6.0–8.3)

## 2007-01-28 LAB — CBC WITH DIFFERENTIAL/PLATELET
Basophils Absolute: 0 10*3/uL (ref 0.0–0.1)
Eosinophils Absolute: 0.1 10*3/uL (ref 0.0–0.5)
HGB: 11.3 g/dL — ABNORMAL LOW (ref 11.6–15.9)
MCV: 99.1 fL (ref 81.0–101.0)
MONO#: 0.3 10*3/uL (ref 0.1–0.9)
MONO%: 9.5 % (ref 0.0–13.0)
NEUT#: 2.7 10*3/uL (ref 1.5–6.5)
RBC: 3.17 10*6/uL — ABNORMAL LOW (ref 3.70–5.32)
RDW: 14.3 % (ref 11.3–14.5)
WBC: 3.3 10*3/uL — ABNORMAL LOW (ref 3.9–10.0)
lymph#: 0.2 10*3/uL — ABNORMAL LOW (ref 0.9–3.3)

## 2007-01-30 ENCOUNTER — Encounter (INDEPENDENT_AMBULATORY_CARE_PROVIDER_SITE_OTHER): Payer: Self-pay | Admitting: General Surgery

## 2007-01-30 ENCOUNTER — Inpatient Hospital Stay (HOSPITAL_COMMUNITY): Admission: RE | Admit: 2007-01-30 | Discharge: 2007-02-06 | Payer: Self-pay | Admitting: General Surgery

## 2007-04-03 ENCOUNTER — Ambulatory Visit: Payer: Self-pay | Admitting: Hematology and Oncology

## 2007-04-05 LAB — COMPREHENSIVE METABOLIC PANEL
AST: 13 U/L (ref 0–37)
Albumin: 3.8 g/dL (ref 3.5–5.2)
Alkaline Phosphatase: 80 U/L (ref 39–117)
BUN: 13 mg/dL (ref 6–23)
Potassium: 3.3 mEq/L — ABNORMAL LOW (ref 3.5–5.3)
Total Bilirubin: 0.6 mg/dL (ref 0.3–1.2)

## 2007-04-05 LAB — CBC WITH DIFFERENTIAL/PLATELET
Basophils Absolute: 0 10*3/uL (ref 0.0–0.1)
EOS%: 1.5 % (ref 0.0–7.0)
HGB: 8.9 g/dL — ABNORMAL LOW (ref 11.6–15.9)
MCH: 31.9 pg (ref 26.0–34.0)
MCV: 93.5 fL (ref 81.0–101.0)
MONO%: 5.6 % (ref 0.0–13.0)
RBC: 2.81 10*6/uL — ABNORMAL LOW (ref 3.70–5.32)
RDW: 14 % (ref 11.3–14.5)

## 2007-04-08 ENCOUNTER — Ambulatory Visit (HOSPITAL_COMMUNITY): Admission: RE | Admit: 2007-04-08 | Discharge: 2007-04-08 | Payer: Self-pay | Admitting: Hematology and Oncology

## 2007-04-17 LAB — BASIC METABOLIC PANEL
Potassium: 3.6 mEq/L (ref 3.5–5.3)
Sodium: 142 mEq/L (ref 135–145)

## 2007-06-05 ENCOUNTER — Ambulatory Visit: Payer: Self-pay | Admitting: Hematology and Oncology

## 2007-07-16 ENCOUNTER — Ambulatory Visit (HOSPITAL_COMMUNITY): Admission: RE | Admit: 2007-07-16 | Discharge: 2007-07-16 | Payer: Self-pay | Admitting: Hematology and Oncology

## 2007-07-16 LAB — CBC WITH DIFFERENTIAL/PLATELET
Eosinophils Absolute: 0.2 10*3/uL (ref 0.0–0.5)
HGB: 12.3 g/dL (ref 11.6–15.9)
MONO#: 0.3 10*3/uL (ref 0.1–0.9)
NEUT#: 3.7 10*3/uL (ref 1.5–6.5)
Platelets: 219 10*3/uL (ref 145–400)
RBC: 3.87 10*6/uL (ref 3.70–5.32)
RDW: 14.1 % (ref 11.3–14.5)
WBC: 4.7 10*3/uL (ref 3.9–10.0)

## 2007-07-16 LAB — COMPREHENSIVE METABOLIC PANEL
Albumin: 3.9 g/dL (ref 3.5–5.2)
CO2: 24 mEq/L (ref 19–32)
Calcium: 9.3 mg/dL (ref 8.4–10.5)
Chloride: 109 mEq/L (ref 96–112)
Glucose, Bld: 69 mg/dL — ABNORMAL LOW (ref 70–99)
Potassium: 3.9 mEq/L (ref 3.5–5.3)
Sodium: 140 mEq/L (ref 135–145)
Total Protein: 7.7 g/dL (ref 6.0–8.3)

## 2007-07-16 LAB — CEA: CEA: 3.4 ng/mL (ref 0.0–5.0)

## 2007-07-16 LAB — LACTATE DEHYDROGENASE: LDH: 139 U/L (ref 94–250)

## 2007-07-17 ENCOUNTER — Ambulatory Visit: Payer: Self-pay | Admitting: Hematology and Oncology

## 2007-09-03 ENCOUNTER — Ambulatory Visit: Payer: Self-pay | Admitting: Hematology and Oncology

## 2007-11-15 ENCOUNTER — Ambulatory Visit: Payer: Self-pay | Admitting: Hematology and Oncology

## 2007-12-20 ENCOUNTER — Ambulatory Visit: Payer: Self-pay | Admitting: Hematology and Oncology

## 2007-12-20 LAB — CBC WITH DIFFERENTIAL/PLATELET
BASO%: 0.1 % (ref 0.0–2.0)
Basophils Absolute: 0 10*3/uL (ref 0.0–0.1)
Eosinophils Absolute: 0.2 10*3/uL (ref 0.0–0.5)
HCT: 34.9 % (ref 34.8–46.6)
HGB: 12.3 g/dL (ref 11.6–15.9)
MCHC: 35.2 g/dL (ref 32.0–36.0)
MONO#: 0.3 10*3/uL (ref 0.1–0.9)
NEUT#: 3.5 10*3/uL (ref 1.5–6.5)
NEUT%: 78.5 % — ABNORMAL HIGH (ref 39.6–76.8)
WBC: 4.5 10*3/uL (ref 3.9–10.0)
lymph#: 0.5 10*3/uL — ABNORMAL LOW (ref 0.9–3.3)

## 2007-12-20 LAB — COMPREHENSIVE METABOLIC PANEL
Albumin: 4.4 g/dL (ref 3.5–5.2)
BUN: 21 mg/dL (ref 6–23)
CO2: 21 mEq/L (ref 19–32)
Glucose, Bld: 125 mg/dL — ABNORMAL HIGH (ref 70–99)
Sodium: 143 mEq/L (ref 135–145)
Total Bilirubin: 0.4 mg/dL (ref 0.3–1.2)
Total Protein: 7.2 g/dL (ref 6.0–8.3)

## 2007-12-23 ENCOUNTER — Ambulatory Visit (HOSPITAL_COMMUNITY): Admission: RE | Admit: 2007-12-23 | Discharge: 2007-12-23 | Payer: Self-pay | Admitting: Hematology and Oncology

## 2008-02-17 ENCOUNTER — Ambulatory Visit: Payer: Self-pay | Admitting: Hematology and Oncology

## 2008-04-10 ENCOUNTER — Ambulatory Visit: Payer: Self-pay | Admitting: Hematology and Oncology

## 2008-04-17 LAB — CBC WITH DIFFERENTIAL/PLATELET
Basophils Absolute: 0 10*3/uL (ref 0.0–0.1)
EOS%: 4.3 % (ref 0.0–7.0)
Eosinophils Absolute: 0.2 10*3/uL (ref 0.0–0.5)
HGB: 12.9 g/dL (ref 11.6–15.9)
LYMPH%: 10.3 % — ABNORMAL LOW (ref 14.0–48.0)
MCH: 33.4 pg (ref 26.0–34.0)
MCV: 96.2 fL (ref 81.0–101.0)
MONO%: 6.6 % (ref 0.0–13.0)
NEUT#: 3.2 10*3/uL (ref 1.5–6.5)
Platelets: 176 10*3/uL (ref 145–400)
RBC: 3.85 10*6/uL (ref 3.70–5.32)

## 2008-04-17 LAB — LACTATE DEHYDROGENASE: LDH: 171 U/L (ref 94–250)

## 2008-04-17 LAB — COMPREHENSIVE METABOLIC PANEL
BUN: 25 mg/dL — ABNORMAL HIGH (ref 6–23)
CO2: 26 mEq/L (ref 19–32)
Creatinine, Ser: 1.37 mg/dL — ABNORMAL HIGH (ref 0.40–1.20)
Glucose, Bld: 48 mg/dL — ABNORMAL LOW (ref 70–99)
Total Bilirubin: 0.5 mg/dL (ref 0.3–1.2)

## 2008-05-29 ENCOUNTER — Ambulatory Visit: Payer: Self-pay | Admitting: Hematology and Oncology

## 2008-08-13 ENCOUNTER — Ambulatory Visit: Payer: Self-pay | Admitting: Hematology and Oncology

## 2008-08-17 ENCOUNTER — Ambulatory Visit: Admission: RE | Admit: 2008-08-17 | Discharge: 2008-08-17 | Payer: Self-pay | Admitting: Hematology and Oncology

## 2008-08-17 LAB — CBC WITH DIFFERENTIAL/PLATELET
Basophils Absolute: 0 10*3/uL (ref 0.0–0.1)
Eosinophils Absolute: 0.1 10*3/uL (ref 0.0–0.5)
LYMPH%: 15.4 % (ref 14.0–49.7)
MCV: 95.9 fL (ref 79.5–101.0)
MONO%: 7.7 % (ref 0.0–14.0)
NEUT#: 2.7 10*3/uL (ref 1.5–6.5)
NEUT%: 73.2 % (ref 38.4–76.8)
Platelets: 188 10*3/uL (ref 145–400)
RBC: 4.24 10*6/uL (ref 3.70–5.45)

## 2008-08-17 LAB — COMPREHENSIVE METABOLIC PANEL
Alkaline Phosphatase: 91 U/L (ref 39–117)
BUN: 21 mg/dL (ref 6–23)
Creatinine, Ser: 1.41 mg/dL — ABNORMAL HIGH (ref 0.40–1.20)
Glucose, Bld: 96 mg/dL (ref 70–99)
Total Bilirubin: 1.1 mg/dL (ref 0.3–1.2)

## 2008-10-12 ENCOUNTER — Ambulatory Visit: Payer: Self-pay | Admitting: Hematology and Oncology

## 2008-12-07 ENCOUNTER — Ambulatory Visit: Payer: Self-pay | Admitting: Hematology and Oncology

## 2009-01-29 ENCOUNTER — Ambulatory Visit: Payer: Self-pay | Admitting: Hematology and Oncology

## 2009-03-25 ENCOUNTER — Ambulatory Visit: Payer: Self-pay | Admitting: Hematology and Oncology

## 2009-03-29 LAB — CBC WITH DIFFERENTIAL/PLATELET
Basophils Absolute: 0 10*3/uL (ref 0.0–0.1)
Eosinophils Absolute: 0.1 10*3/uL (ref 0.0–0.5)
HGB: 13.4 g/dL (ref 11.6–15.9)
MCV: 99.4 fL (ref 79.5–101.0)
MONO#: 0.3 10*3/uL (ref 0.1–0.9)
MONO%: 8.5 % (ref 0.0–14.0)
NEUT#: 3.1 10*3/uL (ref 1.5–6.5)
RBC: 3.95 10*6/uL (ref 3.70–5.45)
RDW: 13 % (ref 11.2–14.5)
WBC: 4.1 10*3/uL (ref 3.9–10.3)
lymph#: 0.5 10*3/uL — ABNORMAL LOW (ref 0.9–3.3)

## 2009-03-29 LAB — COMPREHENSIVE METABOLIC PANEL
AST: 27 U/L (ref 0–37)
Albumin: 4.1 g/dL (ref 3.5–5.2)
Alkaline Phosphatase: 83 U/L (ref 39–117)
BUN: 19 mg/dL (ref 6–23)
Calcium: 9.3 mg/dL (ref 8.4–10.5)
Chloride: 106 mEq/L (ref 96–112)
Glucose, Bld: 89 mg/dL (ref 70–99)
Potassium: 4.1 mEq/L (ref 3.5–5.3)
Sodium: 138 mEq/L (ref 135–145)
Total Protein: 7.4 g/dL (ref 6.0–8.3)

## 2009-04-01 ENCOUNTER — Ambulatory Visit (HOSPITAL_COMMUNITY): Admission: RE | Admit: 2009-04-01 | Discharge: 2009-04-01 | Payer: Self-pay | Admitting: Hematology and Oncology

## 2009-05-06 ENCOUNTER — Ambulatory Visit: Payer: Self-pay | Admitting: Hematology and Oncology

## 2009-06-17 ENCOUNTER — Ambulatory Visit: Payer: Self-pay | Admitting: Hematology and Oncology

## 2009-07-30 ENCOUNTER — Ambulatory Visit: Payer: Self-pay | Admitting: Hematology and Oncology

## 2009-08-27 LAB — COMPREHENSIVE METABOLIC PANEL
ALT: 22 U/L (ref 0–35)
AST: 29 U/L (ref 0–37)
Alkaline Phosphatase: 99 U/L (ref 39–117)
Potassium: 4.5 mEq/L (ref 3.5–5.3)
Sodium: 140 mEq/L (ref 135–145)
Total Bilirubin: 0.5 mg/dL (ref 0.3–1.2)
Total Protein: 7.8 g/dL (ref 6.0–8.3)

## 2009-08-27 LAB — CBC WITH DIFFERENTIAL/PLATELET
BASO%: 0.1 % (ref 0.0–2.0)
LYMPH%: 9.6 % — ABNORMAL LOW (ref 14.0–49.7)
MCHC: 34.7 g/dL (ref 31.5–36.0)
MCV: 97.9 fL (ref 79.5–101.0)
MONO%: 6.6 % (ref 0.0–14.0)
Platelets: 184 10*3/uL (ref 145–400)
RBC: 4.29 10*6/uL (ref 3.70–5.45)
RDW: 13 % (ref 11.2–14.5)
WBC: 5.6 10*3/uL (ref 3.9–10.3)

## 2009-08-31 ENCOUNTER — Ambulatory Visit: Payer: Self-pay | Admitting: Hematology and Oncology

## 2009-09-03 ENCOUNTER — Ambulatory Visit (HOSPITAL_COMMUNITY): Admission: RE | Admit: 2009-09-03 | Discharge: 2009-09-03 | Payer: Self-pay | Admitting: Hematology and Oncology

## 2009-11-03 ENCOUNTER — Ambulatory Visit: Payer: Self-pay | Admitting: Hematology and Oncology

## 2009-12-17 ENCOUNTER — Ambulatory Visit: Payer: Self-pay | Admitting: Hematology and Oncology

## 2009-12-23 ENCOUNTER — Ambulatory Visit (HOSPITAL_COMMUNITY): Admission: RE | Admit: 2009-12-23 | Discharge: 2009-12-23 | Payer: Self-pay | Admitting: Hematology and Oncology

## 2009-12-31 ENCOUNTER — Ambulatory Visit (HOSPITAL_COMMUNITY): Admission: RE | Admit: 2009-12-31 | Discharge: 2009-12-31 | Payer: Self-pay | Admitting: General Surgery

## 2010-03-10 ENCOUNTER — Ambulatory Visit: Payer: Self-pay | Admitting: Hematology and Oncology

## 2010-04-01 LAB — CBC WITH DIFFERENTIAL/PLATELET
BASO%: 0.2 % (ref 0.0–2.0)
Basophils Absolute: 0 10*3/uL (ref 0.0–0.1)
EOS%: 3.2 % (ref 0.0–7.0)
Eosinophils Absolute: 0.1 10*3/uL (ref 0.0–0.5)
HCT: 39.7 % (ref 34.8–46.6)
HGB: 13.6 g/dL (ref 11.6–15.9)
LYMPH%: 15.2 % (ref 14.0–49.7)
MCH: 32.9 pg (ref 25.1–34.0)
MCHC: 34.3 g/dL (ref 31.5–36.0)
MCV: 95.9 fL (ref 79.5–101.0)
MONO#: 0.3 10*3/uL (ref 0.1–0.9)
MONO%: 8.1 % (ref 0.0–14.0)
NEUT#: 3 10*3/uL (ref 1.5–6.5)
NEUT%: 73.3 % (ref 38.4–76.8)
Platelets: 152 10*3/uL (ref 145–400)
RBC: 4.14 10*6/uL (ref 3.70–5.45)
RDW: 12.8 % (ref 11.2–14.5)
WBC: 4.1 10*3/uL (ref 3.9–10.3)
lymph#: 0.6 10*3/uL — ABNORMAL LOW (ref 0.9–3.3)
nRBC: 0 % (ref 0–0)

## 2010-04-01 LAB — COMPREHENSIVE METABOLIC PANEL
ALT: 25 U/L (ref 0–35)
AST: 26 U/L (ref 0–37)
Albumin: 4.5 g/dL (ref 3.5–5.2)
Alkaline Phosphatase: 81 U/L (ref 39–117)
BUN: 21 mg/dL (ref 6–23)
CO2: 24 mEq/L (ref 19–32)
Calcium: 9.5 mg/dL (ref 8.4–10.5)
Chloride: 105 mEq/L (ref 96–112)
Creatinine, Ser: 1.16 mg/dL (ref 0.40–1.20)
Glucose, Bld: 85 mg/dL (ref 70–99)
Potassium: 4 mEq/L (ref 3.5–5.3)
Sodium: 141 mEq/L (ref 135–145)
Total Bilirubin: 0.5 mg/dL (ref 0.3–1.2)
Total Protein: 7 g/dL (ref 6.0–8.3)

## 2010-04-01 LAB — CEA: CEA: 5.1 ng/mL — ABNORMAL HIGH (ref 0.0–5.0)

## 2010-04-02 ENCOUNTER — Encounter: Payer: Self-pay | Admitting: Gastroenterology

## 2010-04-02 ENCOUNTER — Encounter: Payer: Self-pay | Admitting: Obstetrics

## 2010-04-03 ENCOUNTER — Encounter: Payer: Self-pay | Admitting: Thoracic Surgery

## 2010-04-03 ENCOUNTER — Encounter: Payer: Self-pay | Admitting: Hematology and Oncology

## 2010-04-04 ENCOUNTER — Encounter: Payer: Self-pay | Admitting: Hematology and Oncology

## 2010-04-06 ENCOUNTER — Other Ambulatory Visit: Payer: Self-pay | Admitting: Hematology and Oncology

## 2010-04-06 DIAGNOSIS — C20 Malignant neoplasm of rectum: Secondary | ICD-10-CM

## 2010-05-25 LAB — CBC
MCH: 32.7 pg (ref 26.0–34.0)
MCHC: 33.8 g/dL (ref 30.0–36.0)
MCV: 96.6 fL (ref 78.0–100.0)
Platelets: 163 10*3/uL (ref 150–400)
RBC: 4.16 MIL/uL (ref 3.87–5.11)
RDW: 12.4 % (ref 11.5–15.5)

## 2010-05-25 LAB — BASIC METABOLIC PANEL
BUN: 17 mg/dL (ref 6–23)
CO2: 26 mEq/L (ref 19–32)
Calcium: 9.3 mg/dL (ref 8.4–10.5)
Creatinine, Ser: 1.39 mg/dL — ABNORMAL HIGH (ref 0.4–1.2)
Glucose, Bld: 93 mg/dL (ref 70–99)

## 2010-07-26 NOTE — H&P (Signed)
NAMESUNDAI, Heather Rasmussen              ACCOUNT NO.:  [id number]   MEDICAL RECORD NO.:  [id number]          PATIENT TYPE:  INP   LOCATION:  1536                         FACILITY:  [facility]   PHYSICIAN:  Adolph Pollack, M.D.DATE OF BIRTH:  [birth year]   DATE OF ADMISSION:  01/30/2007  DATE OF DISCHARGE:                              HISTORY & PHYSICAL   REASON:  Closure of loop ileostomy.   HISTORY OF PRESENT ILLNESS:  Heather Rasmussen is a 60 year old female with  metastatic rectal cancer.  She underwent a low anterior resection of the  rectum with loop ileostomy and a wedge biopsy of the liver mass which  turned out to be metastatic cancer May 02, 2006.  She subsequently  underwent more chemotherapy and then a partial hepatectomy over at [facility].  She has had intermittent  problems with dehydration because of high ileostomy output including one  requiring admission to the hospital for acute renal insufficiency.  This  was also complicated by urinary tract infection.  She has recovered from  that and had attempted to stay as well hydrated as she could.  She now  is in better health, stronger, and is admitted for loop ileostomy  closure.   PAST MEDICAL HISTORY:  1. Metastatic rectal cancer.  2. Urinary tract infection.  3. History of hypertension.  4. Acute renal insufficiency secondary to severe dehydration.   PREVIOUS ABDOMINAL OPERATIONS:  1. Low anterior resection of the rectum.  2. Partial hepatectomy.   CURRENT MEDICATIONS:  .  None.   ALLERGIES:  None.   SOCIAL HISTORY:  She occasionally has smoked cigarettes and occasionally  has an alcoholic beverage.   REVIEW OF SYSTEMS:  She has been trying to keep up with her hydration,  and her energy level has been much better overall.   PHYSICAL EXAMINATION:  GENERAL:  A thin female in no acute distress.  Pleasant, cooperative, a little bit anxious.  VITAL SIGNS:  Temperature is she is  afebrile.  Blood pressure and pulse  are within normal limits.  EYES:  Extraocular motions intact.  No icterus.  Mouth - mucous  membranes are moist.  NECK:  Supple without masses.  CHEST:  Demonstrates a palpable Port-A-Cath left upper chest wall.  Breath sounds are clear.  CARDIOVASCULAR:  Regular rate, regular rhythm.  ABDOMEN:  Soft.  There is a lower midline scar and a right upper  quadrant scar as well as right lower quadrant stomas.  There is no  tenderness.  MUSCULOSKELETAL:  SCDs on extremities.   LABORATORY DATA:  Is normal for a hemoglobin 11.3, white count 3600.  BUN 31, creatinine 1.96, potassium is 3.3.   IMPRESSION:  Loop ileostomy with intermittent problems with acute renal  insufficiency secondary to dehydration.   PLAN:  Closure of loop ileostomy.  We discussed the procedure and risks  in detail preoperatively.      Adolph Pollack, M.D.  Electronically Signed     TJR/MEDQ  D:  01/30/2007  T:  01/30/2007  Job:  [id number]

## 2010-07-26 NOTE — Discharge Summary (Signed)
Heather Rasmussen, Heather Rasmussen              ACCOUNT NO.:  [id number]   MEDICAL RECORD NO.:  [id number]          PATIENT TYPE:  INP   LOCATION:  1536                         FACILITY:  [facility]   PHYSICIAN:  Adolph Pollack, M.D.DATE OF BIRTH:  [birth year]   DATE OF ADMISSION:  01/30/2007  DATE OF DISCHARGE:  02/06/2007                               DISCHARGE SUMMARY   FINAL DIAGNOSIS:  Retained loop ileostomy.   SECONDARY DIAGNOSES:  1. Metastatic rectal cancer.  2. Postoperative ileus.  3. Acute blood loss anemia on chronic anemia.   PROCEDURES PERFORMED:  Closure of loop ileostomy and partial small bowel  resection.   REASON FOR ADMISSION:  This 60 year old female had a low rectal cancer.  She underwent low anterior resection followed by loop ileostomy.  This  was after chemotherapy and radiation.  She was found to have liver  metastasis and underwent a partial hepatectomy.  She is now felt to be  disease-free and presents for her loop ileostomy closure.   HOSPITAL COURSE:  She underwent the closure of loop ileostomy which she  tolerated well.  She came in anemic and had a hemoglobin that got down  to about an 8.6, but remained hemodynamically stable with good urine  output.  She did develop a postoperative ileus.  I ended up starting her  on some Reglan on her fifth postoperative day.  X-rays of that day also  were consistent with ileus.  She began to slowly improve following that.  On her sixth postoperative day, her bowels started to move, she began  passing gas and we advanced her diet.  On her seventh postoperative day,  we advanced her diet some more and gave her the option of going home  later that day if she tolerated a diet or waiting until the eighth  postoperative day.  She dealt well with her diet, passed a lot of gas,  moved her bowels and requested to be discharged February 06, 2007.   DISPOSITION:  Discharged to home in satisfactory condition on February 06, 2007.   DISCHARGE INSTRUCTIONS:  We have gone over discharge instructions  extensively and they have been written down and a sheet given to her.  She is to take multivitamin with iron and Tylox for pain.  She will come  back and see me in a couple of weeks for an appointment.      Adolph Pollack, M.D.  Electronically Signed     TJR/MEDQ  D:  02/19/2007  T:  02/19/2007  Job:  [id number]   cc:   Anselmo Rod, M.D.  Fax: [phone]   Lauretta I. Odogwu, M.D.  Fax: [phone]

## 2010-07-26 NOTE — Discharge Summary (Signed)
Heather Rasmussen, Heather Rasmussen              ACCOUNT NO.:  [id number]   MEDICAL RECORD NO.:  [id number]          PATIENT TYPE:  INP   LOCATION:  5009                         FACILITY:  [facility]   PHYSICIAN:  Ritta Slot, MD     DATE OF BIRTH:  04-Dec-1950   DATE OF ADMISSION:  11/07/2006  DATE OF DISCHARGE:  11/11/2006                               DISCHARGE SUMMARY   DISCHARGE DIAGNOSES:  1. Severe dehydration secondary to ileostomy and chemotherapy.  2. Acute renal insufficiency secondary to #1.  3. Shortness of breath secondary to #1, resolved.  4. Urinary tract infection.  5. Metastatic cancer.  6. Severe hypotension secondary to #1, improved.   DISCHARGE MEDICATIONS:  1. Imodium 2 mg twice a day.  2. Cipro 250 mg 1 daily for 2 more days.  3. Ferrous sulfate 325 mg daily.  4. Ensure chocolate pudding 3 times a day after meals.  5. Klor-Con 20 mEq every other day.  6. Have lab work done on Wednesday, November 14, 2006.   DISCHARGE INSTRUCTIONS:  1. Activity without restrictions.  2. Regular diet, but drink plenty fluids.  3. Follow up with Dr. Lynnea Ferrier in 2 weeks.  The office will call with      date and time.  4. Do not take metoprolol.   PROCEDURES:  1. Transfused 1 unit of packed cells for low hemoglobin.  2. A 2-D echo was done on October 10, 2006.  At that time, EF was 65-70%,      and no pericardial effusion, trivial tricuspid valve regurg.   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  She was also found to have  metastatic disease in the pathology staging with liver involvement.  She  underwent liver resection June 21, 2006.  Beginning August 22, 2006, she  underwent one cycle FOLFOX and Avastin, which has been on hold since  that time secondary to pancytopenia.  She has had  severe amount of  weakness and dehydration since then.  Her creatinine has increased from  1.17 to 4.3.  She was having shortness of breath, and was referred to  Dr. Lynnea Ferrier.  She has a continued weight loss, and one episode of actual  syncope per her family.  She was admitted on November 07, 2006 for  hydration and further management.   PAST MEDICAL HISTORY:  As stated above.  She does have hypotension with  history of hypertension.  Had been on metoprolol at one point.   SOCIAL HISTORY:  Does not smoke.  No alcohol.  Lives with her mother,  who is her main caregiver.  Quit smoking in 2007.   OUTPATIENT MEDICATIONS:  Klor-Con 20 daily.   ALLERGIES:  No known allergies.   Family history, social history, review of systems:  See H&P.   PHYSICAL EXAM AT DISCHARGE:  VITAL SIGNS:  Blood pressure 98-100/62,  pulse 74, respirations 18, temp 97.7, oxygen saturation 100%.  HEART:  Regular rate and rhythm.  S1, S2.  LUNGS:  Clear.  ABDOMEN:  Positive bowel sounds.  EXTREMITIES:  Without edema.   LABORATORY DATA:  On admission, hemoglobin 12.2, hematocrit 35.6, WBC  4.5, platelets 279; this was when she was severely dehydrated.  With  hydration, her hemoglobin dropped to 9.8, hematocrit 28.2, and a white  count dropped to 3.1, and then on August 30, her hemoglobin was 8.8 with  hematocrit of 25.1, WBC 2.7, platelets 172, neutrophils 79.  She was  then transfused 1 unit of packed cells, and on day of discharge,  hemoglobin 12.5, hematocrit 36, WBC 3.2, platelets 157.  Chemistry on  admission:  Sodium 135, potassium 3.3, chloride 105, CO2 of 16, BUN 50,  creatinine 5.37, and glucose 102.  With IV fluids, she did develop  hypokalemia, which was replaced, and by November 11, 2006, her sodium is  143, potassium 5.1, chloride 126, CO2 of 15, glucose 79, BUN 12,  creatinine 1.36.  We did stool for hemoccult, which was negative.  Coags:  Pro time 14, INR 1.1, PTT 26.  LFTs were normal.  Albumin 3.6.   Calcium at discharge was 7.8.  UA with a large amount of leukocytes,  bacteria few, WBC 21-50.  Urine culture at discharge was pending.   HOSPITAL COURSE:  The patient was admitted by Dr. Lynnea Ferrier secondary to  acute renal insufficiency, dehydration with associated shortness of  breath.  She was given normal saline 100 an hour with several boluses of  500 mL and 250 mL due to hypotension with blood pressures in the 80s.  She was started on Cipro for a UTI, but only once a day secondary to her  renal insufficiency and creatinine clearance.  A nutrition assessment  was done by a nutritionist, and ensure pudding was recommended.   By August 30, hemoglobin had dropped to 8.8 with hydration, though  hemoccult on ileostomy drainage was negative.  She as transfused 1 unit  of packed cells.  By November 11, 2006, she had been transferred to a  nontelemetry  bed, was stable.  We cut her fluids back to 25 mL an hour, and had her  ambulate in the hallway.  She had no dizziness, no lightheadedness.  She  felt much better on the Imodium, and will be discharged with the Imodium  twice a day.  She will follow up with Dr. Lynnea Ferrier in 2 weeks, and her  oncologist as previously instructed.      Darcella Gasman. [place], N.P.      Ritta Slot, MD  Electronically Signed    LRI/MEDQ  D:  11/11/2006  T:  11/11/2006  Job:  [id number]   cc:   Vicente Serene I. Odogwu, M.D.  Adolph Pollack, M.D.  Anselmo Rod, M.D.

## 2010-07-26 NOTE — Op Note (Signed)
Heather Rasmussen, Heather Rasmussen              ACCOUNT NO.:  [id number]   MEDICAL RECORD NO.:  [id number]          PATIENT TYPE:  INP   LOCATION:  1536                         FACILITY:  [facility]   PHYSICIAN:  Adolph Pollack, M.D.DATE OF BIRTH:  04/14/1950   DATE OF PROCEDURE:  01/30/2007  DATE OF DISCHARGE:                               OPERATIVE REPORT   PREOPERATIVE DIAGNOSIS:  Loop ileostomy.   POSTOPERATIVE DIAGNOSIS:  Loop ileostomy.   PROCEDURE:  Closure of loop ileostomy with partial small-bowel  resection.   SURGEON:  Adolph Pollack, M.D.   ASSISTANT:  Harriette Bouillon, MD   ANESTHESIA:  General.   INDICATION:  This 60 year old female had rectal cancer that is  metastatic.  She underwent a low anterior resection and a loop ileostomy  and at the time of that surgery she was found have a metastatic lesion  to her liver.  She subsequently has undergone a number of rounds of  chemotherapy (she had had neoadjuvant chemotherapy and radiation) as  well as partial hepatectomy.  She is now strong enough to undergo the  loop ileostomy closure and presents for that.   TECHNIQUE:  She is seen in the holding area and brought to the operating  room and placed supine on the operating table.  General anesthetic was  administered.  Foley catheter was inserted into her bladder.  The stomal  appliance was removed.  The abdominal area and stoma were sterilely  prepped and draped.  An incision was made around the loop ileostomy to  include about 2 mm of skin down to the subcutaneous tissue and using  sharp dissection and select electrocautery, I was able to free up  adhesions down to the fascial level.  I then carefully used sharp  dissection to enter the peritoneal cavity and was able to do this easily  from approximately the 3 o'clock to 9 o'clock position.  However,  adhesions were much firmer from 10 to 2 o'clock.  I used careful sharp  dissection in order to finally free all this up but  there were some  serosal tears here.  I was able to bring up quite a bit of proximal and  distal ileum along with a loop ileostomy.   I decided to go ahead perform a side-to-side stapled anastomosis.  The  afferent limb is the one that had a slight serosal injury so I made an  enterotomy just proximal to these and inserted one part of the stapler  and in efferent limb, made enterotomy and inserted the other end of the  stapler and performed a side-to-side stapled anastomosis.  I then closed  the common defect and refinished the resection of the ileostomy as well  as partial small-bowel resection with the linear noncutting stapler and  sent the specimen off the field.   There is little bit of bleeding from the mesentery which was controlled  with suture ligature.  The anastomosis was patent and viable and under  no tension and was of appropriate width.  A crotch stitch was placed at  the distal anastomotic line with 3-0 silk suture  and the anastomosis was  dropped back into the abdominal cavity.   I inspected the area and there was no bleeding in the abdominal cavity.  I made sure all needle, sponge, and instrument counts were correct.  The  fascia was then approximated with interrupted #1 PDS sutures in a figure-  of-eight type fashion.  The subcutaneous tissue was copiously irrigated  and the skin was closed loosely with staples leaving gaps for drainage.  Bulky dressing was applied.   She tolerated the procedure without any apparent complications and was  taken to the recovery room in satisfactory condition.      Adolph Pollack, M.D.  Electronically Signed     TJR/MEDQ  D:  01/30/2007  T:  01/30/2007  Job:  [id number]   cc:   Ritta Slot, MD  Fax: [phone]   Lauretta I. Odogwu, M.D.  Fax: [phone]

## 2010-09-30 ENCOUNTER — Telehealth (INDEPENDENT_AMBULATORY_CARE_PROVIDER_SITE_OTHER): Payer: Self-pay

## 2010-09-30 NOTE — Telephone Encounter (Signed)
Patient called complaining of an external hemorrhoid that is irritated and bleeds when she wipes.  She has a hx of rectal ca.  She has been doing sitz baths.  It has been a while since she has seen Dr Abbey Chatters so I suggested she came in.  Please call her if you can work her in before he goes on vacation

## 2010-10-04 ENCOUNTER — Encounter (HOSPITAL_BASED_OUTPATIENT_CLINIC_OR_DEPARTMENT_OTHER): Payer: Medicare Other | Admitting: Hematology and Oncology

## 2010-10-04 ENCOUNTER — Ambulatory Visit (HOSPITAL_COMMUNITY)
Admission: RE | Admit: 2010-10-04 | Discharge: 2010-10-04 | Disposition: A | Discharge status: Home / Self Care | Payer: Medicare Other | Source: Ambulatory Visit | Attending: Hematology and Oncology | Admitting: Hematology and Oncology

## 2010-10-04 ENCOUNTER — Other Ambulatory Visit: Payer: Self-pay | Admitting: Hematology and Oncology

## 2010-10-04 DIAGNOSIS — J984 Other disorders of lung: Secondary | ICD-10-CM | POA: Insufficient documentation

## 2010-10-04 DIAGNOSIS — Z452 Encounter for adjustment and management of vascular access device: Secondary | ICD-10-CM

## 2010-10-04 DIAGNOSIS — K7689 Other specified diseases of liver: Secondary | ICD-10-CM | POA: Insufficient documentation

## 2010-10-04 DIAGNOSIS — C20 Malignant neoplasm of rectum: Secondary | ICD-10-CM | POA: Insufficient documentation

## 2010-10-04 LAB — CBC WITH DIFFERENTIAL/PLATELET
Basophils Absolute: 0 10*3/uL (ref 0.0–0.1)
Eosinophils Absolute: 0.1 10*3/uL (ref 0.0–0.5)
LYMPH%: 23 % (ref 14.0–49.7)
MCV: 96.2 fL (ref 79.5–101.0)
MONO%: 10.1 % (ref 0.0–14.0)
NEUT#: 2 10*3/uL (ref 1.5–6.5)
Platelets: 156 10*3/uL (ref 145–400)
RBC: 3.94 10*6/uL (ref 3.70–5.45)

## 2010-10-04 LAB — CMP (CANCER CENTER ONLY)
AST: 26 U/L (ref 11–38)
Albumin: 3.2 g/dL — ABNORMAL LOW (ref 3.3–5.5)
Alkaline Phosphatase: 62 U/L (ref 26–84)
BUN, Bld: 21 mg/dL (ref 7–22)
Potassium: 4.4 mEq/L (ref 3.3–4.7)
Sodium: 139 mEq/L (ref 128–145)

## 2010-10-04 MED ORDER — IOHEXOL 300 MG/ML  SOLN
100.0000 mL | Freq: Once | INTRAMUSCULAR | Status: AC | PRN
  Administered 2010-10-04: 100 mL via INTRAVENOUS

## 2010-10-07 ENCOUNTER — Encounter (HOSPITAL_BASED_OUTPATIENT_CLINIC_OR_DEPARTMENT_OTHER): Payer: Medicare Other | Admitting: Hematology and Oncology

## 2010-10-07 DIAGNOSIS — C787 Secondary malignant neoplasm of liver and intrahepatic bile duct: Secondary | ICD-10-CM

## 2010-10-07 DIAGNOSIS — C20 Malignant neoplasm of rectum: Secondary | ICD-10-CM

## 2010-10-19 ENCOUNTER — Encounter (INDEPENDENT_AMBULATORY_CARE_PROVIDER_SITE_OTHER): Payer: Self-pay

## 2010-10-24 ENCOUNTER — Encounter (INDEPENDENT_AMBULATORY_CARE_PROVIDER_SITE_OTHER): Payer: Self-pay | Admitting: General Surgery

## 2010-10-24 ENCOUNTER — Ambulatory Visit (INDEPENDENT_AMBULATORY_CARE_PROVIDER_SITE_OTHER): Payer: Medicare Other | Admitting: General Surgery

## 2010-10-24 DIAGNOSIS — K626 Ulcer of anus and rectum: Secondary | ICD-10-CM | POA: Insufficient documentation

## 2010-10-24 DIAGNOSIS — A630 Anogenital (venereal) warts: Secondary | ICD-10-CM | POA: Insufficient documentation

## 2010-10-24 NOTE — Progress Notes (Signed)
She is an established patient. She has problems with pruritus ani, pain and bleeding after bowel movements. She saw Dr. Freida Busman for this back in December of 2011. She has tried Desitin and other creams without good results. Then using tissue paper as well as moist wipes and sitz baths. She still has the problem. The sitz bath makes her feel much better.  Physical exam Gen-well-developed well-nourished in no acute distress.  Anal rectal-perianal excoriation with superficial ulcers noted. 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 On digital rectal exam no masses and no blood.  Assessment: Pruritus ani due to perianal excoriations and superficial ulcerations.  Also has perianal condyloma.  Plan: Chemical ablation of condyloma. This was done and she tolerated it well. Use sitz baths after bowel movements and very soft tissue paper. Apply Desitin with aloe to area.  Return visit 3 weeks.

## 2010-10-24 NOTE — Patient Instructions (Signed)
Take sitz bath after every bowel movement. Use of very soft tissue paper. Use Desitin with aloe or something similar. Remove bandage and wash area off in 4 hours.

## 2010-11-21 ENCOUNTER — Ambulatory Visit (INDEPENDENT_AMBULATORY_CARE_PROVIDER_SITE_OTHER): Payer: Medicare Other | Admitting: General Surgery

## 2010-11-21 ENCOUNTER — Encounter (INDEPENDENT_AMBULATORY_CARE_PROVIDER_SITE_OTHER): Payer: Self-pay | Admitting: General Surgery

## 2010-11-21 VITALS — BP 162/100 | HR 70

## 2010-11-21 DIAGNOSIS — A630 Anogenital (venereal) warts: Secondary | ICD-10-CM

## 2010-11-21 MED ORDER — LIDOCAINE HCL 2 % EX GEL: CUTANEOUS | Status: AC

## 2010-11-21 NOTE — Progress Notes (Deleted)
She is an established patient. She has problems with pruritus ani, pain and bleeding after bowel movements. She saw Dr. Allen for this back in December of 2011. She has tried Desitin and other creams without good results. Then using tissue paper as well as moist wipes and sitz baths. She still has the problem. The sitz bath makes her feel much better.  Physical exam Gen-well-developed well-nourished in no acute distress.  Anal rectal-perianal excoriation with superficial ulcers noted. 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 On digital rectal exam no masses and no blood.  Assessment: Pruritus ani due to perianal excoriations and superficial ulcerations.  Also has perianal condyloma.  Plan: Chemical ablation of condyloma. This was done and she tolerated it well. Use sitz baths after bowel movements and very soft tissue paper. Apply Desitin with aloe to area.  Return visit 3 weeks. 

## 2010-11-21 NOTE — Patient Instructions (Signed)
Do not clean area with soap, only warm water.

## 2010-11-21 NOTE — Progress Notes (Signed)
Heather Rasmussen is still having intermittent problems with pain and bleeding. She thinks the anal condyloma are gone.  Exam: Anal rectal-3 condyloma are still there but smaller; still has 2 areas of ulceration in the perianal skin that are tender. I applied Podophyllin to the anal condyloma once again.  Assessment: Anal ulcerations and anal condyloma-condyloma are smaller, repeat application of Podophyllin was done; still has some perianal ulcerations.  Plan: Continue Desitin. And Xylocaine jelly. Return visit 3-4 weeks.

## 2010-12-19 ENCOUNTER — Ambulatory Visit (INDEPENDENT_AMBULATORY_CARE_PROVIDER_SITE_OTHER): Payer: Medicare Other | Admitting: General Surgery

## 2010-12-19 ENCOUNTER — Encounter (INDEPENDENT_AMBULATORY_CARE_PROVIDER_SITE_OTHER): Payer: Self-pay | Admitting: General Surgery

## 2010-12-19 VITALS — BP 152/98 | HR 64 | Temp 96.2°F | Resp 20 | Ht 62.0 in | Wt 145.4 lb

## 2010-12-19 DIAGNOSIS — A630 Anogenital (venereal) warts: Secondary | ICD-10-CM

## 2010-12-19 NOTE — Progress Notes (Signed)
Chief Complaint  Patient presents with  . Other    po reck rectal abscess     HPI Heather Rasmussen is a 60 y.o. female.   HPI She has been undergoing chemical ablation treatment for perianal condyloma. She is here for follow up of that. She also has some anal ulcerations that are painful at times especially after bowel movements. The Desitin and lidocaine jelly are helping with that. Past Medical History  Diagnosis Date  . Cancer 2005    rectal  . Night sweats   . GERD (gastroesophageal reflux disease)   . Rectal bleeding   . Hemorrhoids     Past Surgical History  Procedure Date  . Rectal surgery     due to cancer  . Liver surgery   . Port-a-cath removal   . Ostomy take down     Family History  Problem Relation Age of Onset  . Hypertension Mother   . Hypertension Father     Social History History  Substance Use Topics  . Smoking status: Former Games developer  . Smokeless tobacco: Not on file  . Alcohol Use: 0.0 oz/week    1-2 Glasses of wine per week    No Known Allergies  Current Outpatient Prescriptions  Medication Sig Dispense Refill  . ciprofloxacin (CIPRO) 250 MG tablet Take 250 mg by mouth 2 (two) times daily.        . hydrochlorothiazide (MICROZIDE) 12.5 MG capsule Take 12.5 mg by mouth daily.        Marland Kitchen lidocaine (XYLOCAINE JELLY) 2 % jelly Apply to affected area 4 times a day as needed.  30 mL  1    Review of Systems Review of Systems  Gastrointestinal:       Intermittent anal pain after bowel movements.    Blood pressure 152/98, pulse 64, temperature 96.2 F (35.7 C), resp. rate 20, height 5\' 2"  (1.575 m), weight 145 lb 6 oz (65.942 kg).  Physical Exam Physical Exam  Constitutional: She appears well-developed and well-nourished. No distress.  Genitourinary:       Persistent condyloma are present and the perianal area despite chemical ablation attempts. Small anal ulcerations are present in the right posterior position.    Data  Reviewed  Assessment   Persistent anal condyloma.    Plan    Examination under anesthesia and biopsy and fulguration of anal condyloma. The procedure and risks were discussed with her. The after care was discussed as well. Risks include but not limited to bleeding, infection, recurrence, anesthesia. She seems to understand and agrees with the plan.       Heather Rasmussen J 12/19/2010, 12:15 PM

## 2010-12-20 LAB — CBC
HCT: 24.7 — ABNORMAL LOW
Hemoglobin: 8.6 — ABNORMAL LOW
MCHC: 34.6
MCHC: 34.7
MCHC: 35
Platelets: 136 — ABNORMAL LOW
Platelets: 174
RDW: 13.1
RDW: 14.1
RDW: 14.2
RDW: 14.2

## 2010-12-20 LAB — DIFFERENTIAL
Eosinophils Absolute: 0.1 — ABNORMAL LOW
Lymphs Abs: 0.2 — ABNORMAL LOW
Monocytes Absolute: 0.2
Monocytes Relative: 5
Neutrophils Relative %: 86 — ABNORMAL HIGH

## 2010-12-20 LAB — BASIC METABOLIC PANEL
BUN: 14
BUN: 9
CO2: 20
Calcium: 8 — ABNORMAL LOW
Calcium: 8.2 — ABNORMAL LOW
GFR calc non Af Amer: 37 — ABNORMAL LOW
Glucose, Bld: 104 — ABNORMAL HIGH
Glucose, Bld: 117 — ABNORMAL HIGH
Sodium: 140

## 2010-12-20 LAB — CREATININE, SERUM
Creatinine, Ser: 1.14
Creatinine, Ser: 1.22 — ABNORMAL HIGH
GFR calc Af Amer: 56 — ABNORMAL LOW
GFR calc Af Amer: >60
GFR calc non Af Amer: 46 — ABNORMAL LOW

## 2010-12-20 LAB — COMPREHENSIVE METABOLIC PANEL
ALT: 25
AST: 26
Albumin: 3.9
Calcium: 9.5
GFR calc Af Amer: 32 — ABNORMAL LOW
Glucose, Bld: 59 — ABNORMAL LOW
Potassium: 3.3 — ABNORMAL LOW
Sodium: 141
Total Protein: 7

## 2010-12-23 LAB — COMPREHENSIVE METABOLIC PANEL
AST: 24
CO2: 16 — ABNORMAL LOW
Calcium: 9.8
Creatinine, Ser: 5.37 — ABNORMAL HIGH
GFR calc Af Amer: 10 — ABNORMAL LOW
GFR calc non Af Amer: 8 — ABNORMAL LOW

## 2010-12-23 LAB — DIFFERENTIAL
Basophils Absolute: 0
Basophils Relative: 0
Eosinophils Absolute: 0.1
Eosinophils Relative: 3
Lymphs Abs: 0.2 — ABNORMAL LOW
Neutrophils Relative %: 79 — ABNORMAL HIGH

## 2010-12-23 LAB — BASIC METABOLIC PANEL
BUN: 21
BUN: 43 — ABNORMAL HIGH
CO2: 16 — ABNORMAL LOW
Calcium: 7.8 — ABNORMAL LOW
Calcium: 8 — ABNORMAL LOW
Chloride: 107
Chloride: 119 — ABNORMAL HIGH
Chloride: 122 — ABNORMAL HIGH
Creatinine, Ser: 1.61 — ABNORMAL HIGH
Creatinine, Ser: 2.33 — ABNORMAL HIGH
Creatinine, Ser: 3.79 — ABNORMAL HIGH
GFR calc Af Amer: 26 — ABNORMAL LOW
GFR calc Af Amer: 49 — ABNORMAL LOW
GFR calc non Af Amer: 41 — ABNORMAL LOW
Glucose, Bld: 64 — ABNORMAL LOW
Glucose, Bld: 79
Glucose, Bld: 83
Potassium: 3.2 — ABNORMAL LOW
Potassium: 3.9
Potassium: 5.1
Sodium: 143

## 2010-12-23 LAB — CBC
HCT: 25.1 — ABNORMAL LOW
HCT: 36
Hemoglobin: 12.5
Hemoglobin: 9.8 — ABNORMAL LOW
MCHC: 34.4
MCHC: 34.9
MCV: 95.6
MCV: 96.4
Platelets: 172
Platelets: 177
Platelets: 279
RBC: 3.69 — ABNORMAL LOW
RBC: 3.76 — ABNORMAL LOW
RDW: 15.8 — ABNORMAL HIGH
RDW: 16.1 — ABNORMAL HIGH
RDW: 16.1 — ABNORMAL HIGH
WBC: 2.7 — ABNORMAL LOW
WBC: 3.2 — ABNORMAL LOW

## 2010-12-23 LAB — URINALYSIS, ROUTINE W REFLEX MICROSCOPIC
Bilirubin Urine: NEGATIVE
Glucose, UA: NEGATIVE
Hgb urine dipstick: NEGATIVE
Ketones, ur: NEGATIVE
Protein, ur: NEGATIVE

## 2010-12-23 LAB — URINE CULTURE

## 2010-12-23 LAB — TSH: TSH: 2.246

## 2010-12-23 LAB — APTT: aPTT: 26

## 2010-12-23 LAB — CROSSMATCH
ABO/RH(D): B POS
Antibody Screen: NEGATIVE

## 2010-12-23 LAB — OCCULT BLOOD X 1 CARD TO LAB, STOOL
Fecal Occult Bld: NEGATIVE
Fecal Occult Bld: NEGATIVE
Fecal Occult Bld: NEGATIVE

## 2010-12-23 LAB — URINE MICROSCOPIC-ADD ON

## 2010-12-23 LAB — ABO/RH: ABO/RH(D): B POS

## 2011-01-04 ENCOUNTER — Other Ambulatory Visit (INDEPENDENT_AMBULATORY_CARE_PROVIDER_SITE_OTHER): Payer: Self-pay | Admitting: General Surgery

## 2011-01-04 ENCOUNTER — Encounter (HOSPITAL_COMMUNITY): Payer: Medicare Other

## 2011-01-04 LAB — COMPREHENSIVE METABOLIC PANEL
ALT: 31 U/L (ref 0–35)
AST: 32 U/L (ref 0–37)
CO2: 30 mEq/L (ref 19–32)
Chloride: 104 mEq/L (ref 96–112)
GFR calc Af Amer: 58 mL/min — ABNORMAL LOW (ref >90)
GFR calc non Af Amer: 50 mL/min — ABNORMAL LOW (ref >90)
Glucose, Bld: 79 mg/dL (ref 70–99)
Sodium: 141 mEq/L (ref 135–145)
Total Bilirubin: 0.4 mg/dL (ref 0.3–1.2)

## 2011-01-04 LAB — CBC
HCT: 39.7 % (ref 36.0–46.0)
Hemoglobin: 13.4 g/dL (ref 12.0–15.0)
MCHC: 33.8 g/dL (ref 30.0–36.0)
MCV: 99.3 fL (ref 78.0–100.0)

## 2011-01-04 LAB — DIFFERENTIAL
Basophils Absolute: 0 10*3/uL (ref 0.0–0.1)
Eosinophils Relative: 5 % (ref 0–5)
Lymphocytes Relative: 15 % (ref 12–46)
Lymphs Abs: 0.6 10*3/uL — ABNORMAL LOW (ref 0.7–4.0)
Monocytes Absolute: 0.5 10*3/uL (ref 0.1–1.0)
Neutro Abs: 2.9 10*3/uL (ref 1.7–7.7)

## 2011-01-04 LAB — PROTIME-INR: INR: 0.91 (ref 0.00–1.49)

## 2011-01-06 ENCOUNTER — Ambulatory Visit (HOSPITAL_COMMUNITY)
Admission: RE | Admit: 2011-01-06 | Discharge: 2011-01-06 | Disposition: A | Discharge status: Home / Self Care | Payer: Medicare Other | Source: Ambulatory Visit | Attending: General Surgery | Admitting: General Surgery

## 2011-01-06 ENCOUNTER — Other Ambulatory Visit (INDEPENDENT_AMBULATORY_CARE_PROVIDER_SITE_OTHER): Payer: Self-pay | Admitting: General Surgery

## 2011-01-06 DIAGNOSIS — A630 Anogenital (venereal) warts: Secondary | ICD-10-CM

## 2011-01-06 DIAGNOSIS — Z85048 Personal history of other malignant neoplasm of rectum, rectosigmoid junction, and anus: Secondary | ICD-10-CM | POA: Insufficient documentation

## 2011-01-06 DIAGNOSIS — K219 Gastro-esophageal reflux disease without esophagitis: Secondary | ICD-10-CM | POA: Insufficient documentation

## 2011-01-06 DIAGNOSIS — Z79899 Other long term (current) drug therapy: Secondary | ICD-10-CM | POA: Insufficient documentation

## 2011-01-06 DIAGNOSIS — I10 Essential (primary) hypertension: Secondary | ICD-10-CM | POA: Insufficient documentation

## 2011-01-12 NOTE — Op Note (Signed)
  [name], Heather Rasmussen              ACCOUNT NO.:  [id number]  MEDICAL RECORD NO.:  [id number]  LOCATION:                                 FACILITY:  PHYSICIAN:  Adolph Pollack, M.D.DATE OF BIRTH:  1951-02-24  DATE OF PROCEDURE:  01/06/2011 DATE OF DISCHARGE:                              OPERATIVE REPORT   PREOPERATIVE DIAGNOSIS:  Anal condyloma.  POSTOPERATIVE DIAGNOSIS:  Anal condyloma.  PROCEDURES: 1. Exam under anesthesia. 2. Anoscopy. 3. Excision and fulguration of anal condyloma.  SURGEON:  Adolph Pollack, M.D.  ANESTHESIA:  General plus local (Exparel).  INDICATION:  Heather Rasmussen is a 60 year old female with a history of rectal cancer.  She had noticed some discomfort and irregularities around the anal area.  I have seen her and tried to treat her with Podophyllin; however, she did not  respond to chemical ablation.  She is now here for excision and fulguration of the condyloma.  Procedure, risks, and aftercare were discussed with her preoperatively.  TECHNIQUE:  She was brought to the operating room, placed supine on the operating table, general anesthetic was administered.  She was placed in a lithotomy position.  The perianal area was sterilely prepped and draped.  I did a visual inspection of the perianal area.  On left buttock and the right buttock, there were 2 condyloma measuring approximately 1 cm in size.  I excised both of these and sent it for Pathology.  I then fulgurated this with electrocautery.  Closer to the anal canal in the 5 o'clock position, there was a small condyloma, which I fulgurated with electrocautery.  More posteriorly on the right buttock, there was some condyloma which I fulgurated as well.  I then performed anoscopy looking in the anal canal and I did not see any evidence of condyloma here. Following this, I then anesthetized all areas with Exparel.  I then dressed the areas with Neosporin followed by Xylocaine jelly and a  bulky dressing.  She tolerated the procedure well without any apparent complications and was taken to the recovery room in satisfactory condition.     Adolph Pollack, M.D.     Kari Baars  D:  01/06/2011  T:  01/06/2011  Job:  [id number]  cc:   Oneal Grout Fax: [phone]  Electronically Signed by Avel Peace M.D. on 01/12/2011 09:04:41 PM

## 2011-01-16 ENCOUNTER — Telehealth (INDEPENDENT_AMBULATORY_CARE_PROVIDER_SITE_OTHER): Payer: Self-pay

## 2011-01-16 NOTE — Telephone Encounter (Signed)
Attempted to return pt's call.  LM on voicemail.

## 2011-02-13 ENCOUNTER — Ambulatory Visit (INDEPENDENT_AMBULATORY_CARE_PROVIDER_SITE_OTHER): Payer: Medicare Other | Admitting: General Surgery

## 2011-02-13 ENCOUNTER — Encounter (INDEPENDENT_AMBULATORY_CARE_PROVIDER_SITE_OTHER): Payer: Self-pay | Admitting: General Surgery

## 2011-02-13 VITALS — BP 156/102 | HR 64 | Temp 97.4°F | Resp 12 | Ht 62.0 in | Wt 153.0 lb

## 2011-02-13 DIAGNOSIS — Z9889 Other specified postprocedural states: Secondary | ICD-10-CM

## 2011-02-13 NOTE — Progress Notes (Signed)
Operation:  Excision and fulguration of anal condyloma  Date: January 06, 2011  Pathology: Benign condyle  HPI: Heather Rasmussen is here her first postoperative visit.  She has no pain. Minimal bleeding. She has been keeping the area clean   Physical Exam: Anal rectal-there are open wounds that are clean. At the right side near the 5:00 position is an irregular area consistent with either scar or potential recurrent condyloma.  Assessment: Status post excision and fulguration of anal condyloma-wounds healing well. Question early recurrence.  Plan: Return visit 6 weeks.

## 2011-02-13 NOTE — Patient Instructions (Signed)
Do not use soap to clean your anal area.

## 2011-03-10 ENCOUNTER — Telehealth: Payer: Self-pay | Admitting: *Deleted

## 2011-03-27 ENCOUNTER — Encounter (INDEPENDENT_AMBULATORY_CARE_PROVIDER_SITE_OTHER): Payer: Self-pay | Admitting: General Surgery

## 2011-03-27 ENCOUNTER — Ambulatory Visit (INDEPENDENT_AMBULATORY_CARE_PROVIDER_SITE_OTHER): Payer: Medicare Other | Admitting: General Surgery

## 2011-03-27 VITALS — BP 144/90 | HR 68 | Temp 97.2°F | Resp 18 | Ht 62.0 in | Wt 151.8 lb

## 2011-03-27 DIAGNOSIS — Z9889 Other specified postprocedural states: Secondary | ICD-10-CM

## 2011-03-27 MED ORDER — HYDROCODONE-ACETAMINOPHEN 5-325 MG PO TABS: 1.0000 | ORAL_TABLET | ORAL | Status: AC | PRN

## 2011-03-27 NOTE — Progress Notes (Signed)
Heather Rasmussen is here for another postoperative visit. She's been having diarrhea and severe pain from an open area in the anal region. She did not sleep well last time because of this. She had a cheeseburger and some pinto beans which led to this.  Exam: Anus-or 2 small ulcerated areas at the 10:00 and 2:00 position. The left buttock area there is either a scar her condylomatous-type lesion which is unchanged since when I saw her last time. I applied some podophyllin onto this area.  Assessment: Anal condyloma with possible early recurrence-chemical ablation substance applied. She also has some anal ulceration related to diarrhea.  Plan: Wash anal area 4 hours. Applied Desitin to open ulcerations and anal area. Keep a food diary and avoid foods that lead to diarrhea. Return visit in 3 weeks.

## 2011-03-27 NOTE — Patient Instructions (Signed)
Avoid foods that give you diarrhea.  Use Desitin on anal area.  Wash anal area with warm water in 4 hours.

## 2011-04-11 ENCOUNTER — Other Ambulatory Visit (HOSPITAL_BASED_OUTPATIENT_CLINIC_OR_DEPARTMENT_OTHER): Payer: Medicare Other | Admitting: Lab

## 2011-04-11 DIAGNOSIS — C20 Malignant neoplasm of rectum: Secondary | ICD-10-CM

## 2011-04-11 DIAGNOSIS — C787 Secondary malignant neoplasm of liver and intrahepatic bile duct: Secondary | ICD-10-CM

## 2011-04-11 LAB — COMPREHENSIVE METABOLIC PANEL
AST: 38 U/L — ABNORMAL HIGH (ref 0–37)
Alkaline Phosphatase: 82 U/L (ref 39–117)
BUN: 16 mg/dL (ref 6–23)
Calcium: 9.7 mg/dL (ref 8.4–10.5)
Chloride: 104 mEq/L (ref 96–112)
Creatinine, Ser: 1.19 mg/dL — ABNORMAL HIGH (ref 0.50–1.10)
Total Bilirubin: 0.6 mg/dL (ref 0.3–1.2)

## 2011-04-11 LAB — CBC WITH DIFFERENTIAL/PLATELET
BASO%: 0.2 % (ref 0.0–2.0)
EOS%: 4 % (ref 0.0–7.0)
HCT: 39.8 % (ref 34.8–46.6)
LYMPH%: 16.9 % (ref 14.0–49.7)
MCH: 32.5 pg (ref 25.1–34.0)
MCHC: 34.4 g/dL (ref 31.5–36.0)
MCV: 94.5 fL (ref 79.5–101.0)
MONO%: 14.6 % — ABNORMAL HIGH (ref 0.0–14.0)
NEUT%: 64.3 % (ref 38.4–76.8)
Platelets: 174 10*3/uL (ref 145–400)

## 2011-04-13 ENCOUNTER — Ambulatory Visit: Payer: Medicare Other | Admitting: Hematology and Oncology

## 2011-04-14 ENCOUNTER — Telehealth: Payer: Self-pay | Admitting: Hematology and Oncology

## 2011-04-14 NOTE — Telephone Encounter (Signed)
Pt came in as she missed her 1/31 appt with lo.  Per kasie sch her at next avail.   appt for 2/11 aom

## 2011-04-18 ENCOUNTER — Encounter (INDEPENDENT_AMBULATORY_CARE_PROVIDER_SITE_OTHER): Payer: Self-pay | Admitting: General Surgery

## 2011-04-18 ENCOUNTER — Ambulatory Visit (INDEPENDENT_AMBULATORY_CARE_PROVIDER_SITE_OTHER): Payer: Medicare Other | Admitting: General Surgery

## 2011-04-18 VITALS — BP 138/92 | HR 68 | Temp 97.8°F | Resp 18 | Ht 62.0 in | Wt 154.2 lb

## 2011-04-18 DIAGNOSIS — K626 Ulcer of anus and rectum: Secondary | ICD-10-CM

## 2011-04-18 NOTE — Progress Notes (Signed)
Heather Rasmussen is here for followup of anal condyloma and anal ulcerations. She put the topical treatment I recommended that she is much better.  Exam anal rectal-there is a scar in the area or stricture was a condyloma it looks more like a scar at this point in time. The anal ulcerations have healed.  Assessment: Anal condyloma that have responded to chemical ablation. Anal ulcerations have responded to topical treatment.  Plan: Return visit 3 months

## 2011-04-18 NOTE — Patient Instructions (Signed)
Call if you feel any masses around that area.

## 2011-04-25 ENCOUNTER — Ambulatory Visit (HOSPITAL_BASED_OUTPATIENT_CLINIC_OR_DEPARTMENT_OTHER): Payer: Medicare Other | Admitting: Hematology and Oncology

## 2011-04-25 ENCOUNTER — Telehealth: Payer: Self-pay | Admitting: Hematology and Oncology

## 2011-04-25 VITALS — BP 142/83 | HR 85 | Temp 97.1°F | Ht 62.0 in | Wt 154.5 lb

## 2011-04-25 DIAGNOSIS — C189 Malignant neoplasm of colon, unspecified: Secondary | ICD-10-CM

## 2011-04-25 NOTE — Progress Notes (Signed)
This office note has been dictated.

## 2011-04-25 NOTE — Telephone Encounter (Signed)
11/4 & 11/6 made and printed for pt aom

## 2011-04-25 NOTE — Progress Notes (Signed)
CC:   Adolph Pollack, M.D. Anselmo Rod, MD, Clementeen Graham Oneal Grout  IDENTIFYING STATEMENT:  Patient is a 61 year old woman with colon cancer who presents for followup.  INTERIM HISTORY:  Mrs. Heather Rasmussen was last seen almost 9 months ago and in that time has had no issues or concerns since our last followup visit. She has good energy levels.  She is without nausea, vomiting, abdominal pain.  She has not experienced any rectal bleeding.  MEDICATIONS:  Reviewed and updated.  ALLERGIES:  None.  PHYSICAL EXAM:  Patient is alert or x3.  Vitals:  Pulse 85, blood pressure 140/83, temperature 97.1, respirations 18, weight 204.5 pounds. HEENT:  Head is atraumatic, normocephalic.  Sclerae anicteric.  Mouth moist.  Chest:  Clear.  CVS:  Unremarkable.  Abdomen:  Soft, nontender. Bowel sounds present.  Extremities:  No edema.  LAB DATA:  04/11/2011, white cell count 4.3, hemoglobin 13.7, hematocrit 39.8, platelets 174.  Sodium 139, potassium 4.4, chloride 104, CO2 23, BUN 16, creatinine 1.19, glucose 83, total bilirubin 0.6, alkaline phosphatase 82, AST 38, calcium 9.7.  CEA 2.6.  IMPRESSION AND PLAN:  Mrs. 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  She is status post concurrent external beam radiation therapy with continuous infusion 5-FU between February 12, 2006 and March 20, 2006.  This was followed by a low anterior resection with ileostomy on May 02, 2006.  She also had liver resection on June 21, 2006.  She was not able to tolerate adjuvant FOLFOX due to pancytopenia and electrolyte imbalance secondary to ileostomy.  She subsequently underwent closure of loop ileostomy and partial small-bowel resection in November 2008.  The most recent exam, blood work indicated no evidence of recurrence.  Her last colonoscopy was in November 2011 which was unremarkable.  She follows up in 9 months' time with labs and  scans.    ______________________________ Laurice Record, M.D. LIO/MEDQ  D:  04/25/2011  T:  04/25/2011  Job:  [id number]

## 2011-06-13 ENCOUNTER — Encounter (INDEPENDENT_AMBULATORY_CARE_PROVIDER_SITE_OTHER): Payer: Self-pay | Admitting: General Surgery

## 2012-01-15 ENCOUNTER — Ambulatory Visit (HOSPITAL_COMMUNITY)
Admission: RE | Admit: 2012-01-15 | Discharge: 2012-01-15 | Disposition: A | Discharge status: Home / Self Care | Payer: Medicare Other | Source: Ambulatory Visit | Attending: Hematology and Oncology | Admitting: Hematology and Oncology

## 2012-01-15 ENCOUNTER — Other Ambulatory Visit (HOSPITAL_BASED_OUTPATIENT_CLINIC_OR_DEPARTMENT_OTHER): Payer: Medicare Other | Admitting: Lab

## 2012-01-15 DIAGNOSIS — Z923 Personal history of irradiation: Secondary | ICD-10-CM | POA: Insufficient documentation

## 2012-01-15 DIAGNOSIS — C189 Malignant neoplasm of colon, unspecified: Secondary | ICD-10-CM | POA: Insufficient documentation

## 2012-01-15 LAB — CBC WITH DIFFERENTIAL/PLATELET
Basophils Absolute: 0 10*3/uL (ref 0.0–0.1)
Eosinophils Absolute: 0.1 10*3/uL (ref 0.0–0.5)
HGB: 14.2 g/dL (ref 11.6–15.9)
MCV: 100.3 fL (ref 79.5–101.0)
MONO%: 7 % (ref 0.0–14.0)
NEUT#: 2.8 10*3/uL (ref 1.5–6.5)
RDW: 12.8 % (ref 11.2–14.5)

## 2012-01-15 LAB — COMPREHENSIVE METABOLIC PANEL (CC13)
ALT: 37 U/L (ref 0–55)
AST: 42 U/L — ABNORMAL HIGH (ref 5–34)
Alkaline Phosphatase: 93 U/L (ref 40–150)
BUN: 14 mg/dL (ref 7.0–26.0)
Chloride: 110 mEq/L — ABNORMAL HIGH (ref 98–107)
Creatinine: 1.2 mg/dL — ABNORMAL HIGH (ref 0.6–1.1)

## 2012-01-15 MED ORDER — IOHEXOL 300 MG/ML  SOLN
100.0000 mL | Freq: Once | INTRAMUSCULAR | Status: AC | PRN
  Administered 2012-01-15: 100 mL via INTRAVENOUS

## 2012-01-17 ENCOUNTER — Encounter: Payer: Self-pay | Admitting: Hematology and Oncology

## 2012-01-17 ENCOUNTER — Ambulatory Visit (HOSPITAL_BASED_OUTPATIENT_CLINIC_OR_DEPARTMENT_OTHER): Payer: Medicare Other | Admitting: Hematology and Oncology

## 2012-01-17 ENCOUNTER — Telehealth: Payer: Self-pay | Admitting: Hematology and Oncology

## 2012-01-17 VITALS — BP 172/83 | HR 73 | Temp 97.0°F | Resp 20 | Ht 62.0 in | Wt 153.3 lb

## 2012-01-17 DIAGNOSIS — C189 Malignant neoplasm of colon, unspecified: Secondary | ICD-10-CM | POA: Insufficient documentation

## 2012-01-17 DIAGNOSIS — C20 Malignant neoplasm of rectum: Secondary | ICD-10-CM

## 2012-01-17 DIAGNOSIS — C787 Secondary malignant neoplasm of liver and intrahepatic bile duct: Secondary | ICD-10-CM

## 2012-01-17 NOTE — Progress Notes (Signed)
This office note has been dictated.

## 2012-01-17 NOTE — Telephone Encounter (Signed)
Gave pt appt for lab and CT on August 2014, gave pt oral contrast and NPO 4 hours prior to CT

## 2012-01-17 NOTE — Patient Instructions (Addendum)
Heather Rasmussen  [id number]   [facility] - AFTER VISIT SUMMARY   **RECOMMENDATIONS MADE BY THE CONSULTANT AND ANY TEST    RESULTS WILL BE SENT TO YOUR REFERRING DOCTORS.   YOUR EXAM FINDINGS, LABS AND RESULTS WERE DISCUSSED BY YOUR MD TODAY.  YOU CAN GO TO THE [place] WEB SITE FOR INSTRUCTIONS ON HOW TO ASSESS MY CHART FOR ADDITIONAL INFORMATION AS NEEDED.  Your Updated drug allergies are: Allergies as of 01/17/2012  . (No Known Allergies)    Your current list of medications are: Current Outpatient Prescriptions  Medication Sig Dispense Refill  . amLODipine (NORVASC) 5 MG tablet Take 5 mg by mouth daily.      . ciprofloxacin (CIPRO) 250 MG tablet Take 250 mg by mouth 2 (two) times daily.        . hydrochlorothiazide (MICROZIDE) 12.5 MG capsule Take 12.5 mg by mouth daily.           INSTRUCTIONS GIVEN AND DISCUSSED:  See attached schedule   SPECIAL INSTRUCTIONS/FOLLOW-UP:  See above.  I acknowledge that I have been informed and understand all the instructions given to me and received a copy.I know to contact the clinic, my physician, or go to the emergency Department if any problems should occur.   I do not have any more questions at this time, but understand that I may call the [facility] at [phone] during business hours should I have any further questions or need assistance in obtaining follow-up care.

## 2012-01-18 NOTE — Progress Notes (Signed)
IDENTIFYING STATEMENT:  The patient is a 61 year old woman with colon cancer who presents for followup.  INTERVAL HISTORY:  The patient was seen early this year.  Presents with no current concerns.  Her weight is stable.  Has good energy levels. Denies nausea or vomiting.  She has not experienced any change in bowel function or rectal bleeding. On 01/15/2012 she received a CT scan of the chest, abdomen, and pelvis and there was no evidence of metastasis in the chest.  The abdomen and pelvis also showed no evidence of metastases.  There were stable, low density lesions in the liver.  There was a stable low rectal anastomosis.  MEDICATIONS:  Reviewed and updated.  ALLERGIES:  None.  PAST MEDICAL HISTORY:  Unchanged.  PAST SURGICAL HISTORY:  Unchanged.  FAMILY HISTORY:  Unchanged.  SOCIAL HISTORY:  Unchanged.  REVIEW OF SYSTEMS:  A 10-point review of systems negative.  PHYSICAL EXAM:  The patient is a well-appearing, well-nourished woman in no distress.  Vitals:  Pulse 73, blood pressure 132/83 temperature 97, respirations 20, weight 153 pounds.  HEENT:  Head is atraumatic, normocephalic.  Sclerae anicteric.  Mouth moist.  Chest:  Clear.  CVS: Unremarkable.  Abdomen:  Soft, nontender.  No masses.  Bowel sounds present.  Extremities:  No edema.  LABORATORY DATA:  On 01/15/2012 white cell count 3.8, hemoglobin 14.2, hematocrit 41.1, platelets 169.  Sodium 142, potassium 4, chloride 110, CO2 24, BUN 14, creatinine 1.2, glucose 74.  T bilirubin 0.69, alkaline phosphatase 93, AST 42, AST 37.  CEA 4.7.  Results of CTs are as in interval history.  IMPRESSION/PLAN:  Ms. 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  She is status post concurrent external beam radiation therapy with continuous infusion of 5-FU between 02/12/2006 and 03/20/2006.  This was followed by a low anterior resection with ileostomy on 05/02/2006.  She  also had liver resection on 06/21/2006.  She was not able to tolerate adjuvant FOLFOX due to pancytopenia and electrolyte imbalance secondary to ileostomy. She subsequently underwent closure of loop ileostomy and partial small- bowel resection in 01/2007.  Ms.. Hippert's most up to date exam and CTs indicate no evidence of recurrence. Her last colonoscopy was in 01/2010 and I will leave it with her GI doctor to decide when she is next scoped.   She asked me to fill a form for permanent handicap sticker.  She was told to  see her PCP. She will be followed up in 9 months' time with blood work.    ______________________________ Laurice Record, M.D. LIO/MEDQ  D:  01/17/2012  T:  01/18/2012  Job:  [id number]

## 2012-05-04 ENCOUNTER — Telehealth: Payer: Self-pay | Admitting: Oncology

## 2012-05-04 ENCOUNTER — Encounter: Payer: Self-pay | Admitting: Oncology

## 2012-05-04 NOTE — Telephone Encounter (Signed)
S/w the pt and she is aware of the reassigning from dr lo to dr Truett Perna and a letter with an appt calendar to follow.

## 2012-10-24 ENCOUNTER — Ambulatory Visit (HOSPITAL_COMMUNITY)
Admission: RE | Admit: 2012-10-24 | Discharge: 2012-10-24 | Disposition: A | Discharge status: Home / Self Care | Payer: Medicare Other | Source: Ambulatory Visit | Attending: Hematology and Oncology | Admitting: Hematology and Oncology

## 2012-10-24 ENCOUNTER — Other Ambulatory Visit (HOSPITAL_BASED_OUTPATIENT_CLINIC_OR_DEPARTMENT_OTHER): Payer: Medicare Other

## 2012-10-24 DIAGNOSIS — Z9221 Personal history of antineoplastic chemotherapy: Secondary | ICD-10-CM | POA: Insufficient documentation

## 2012-10-24 DIAGNOSIS — R911 Solitary pulmonary nodule: Secondary | ICD-10-CM | POA: Insufficient documentation

## 2012-10-24 DIAGNOSIS — N281 Cyst of kidney, acquired: Secondary | ICD-10-CM | POA: Insufficient documentation

## 2012-10-24 DIAGNOSIS — C189 Malignant neoplasm of colon, unspecified: Secondary | ICD-10-CM

## 2012-10-24 DIAGNOSIS — Z923 Personal history of irradiation: Secondary | ICD-10-CM | POA: Insufficient documentation

## 2012-10-24 DIAGNOSIS — Z9089 Acquired absence of other organs: Secondary | ICD-10-CM | POA: Insufficient documentation

## 2012-10-24 DIAGNOSIS — N3289 Other specified disorders of bladder: Secondary | ICD-10-CM | POA: Insufficient documentation

## 2012-10-24 DIAGNOSIS — I700 Atherosclerosis of aorta: Secondary | ICD-10-CM | POA: Insufficient documentation

## 2012-10-24 DIAGNOSIS — C20 Malignant neoplasm of rectum: Secondary | ICD-10-CM | POA: Insufficient documentation

## 2012-10-24 LAB — CEA: CEA: 5.7 ng/mL — ABNORMAL HIGH (ref 0.0–5.0)

## 2012-10-24 LAB — CBC WITH DIFFERENTIAL/PLATELET
Eosinophils Absolute: 0.2 10*3/uL (ref 0.0–0.5)
LYMPH%: 16.6 % (ref 14.0–49.7)
MCH: 34.1 pg — ABNORMAL HIGH (ref 25.1–34.0)
MCV: 100.3 fL (ref 79.5–101.0)
MONO%: 9.4 % (ref 0.0–14.0)
NEUT#: 3.3 10*3/uL (ref 1.5–6.5)
Platelets: 176 10*3/uL (ref 145–400)
RBC: 4.16 10*6/uL (ref 3.70–5.45)

## 2012-10-24 LAB — COMPREHENSIVE METABOLIC PANEL (CC13)
ALT: 49 U/L (ref 0–55)
CO2: 22 mEq/L (ref 22–29)
Creatinine: 1.1 mg/dL (ref 0.6–1.1)
Glucose: 72 mg/dl (ref 70–140)
Total Bilirubin: 0.53 mg/dL (ref 0.20–1.20)

## 2012-10-24 MED ORDER — IOHEXOL 300 MG/ML  SOLN
100.0000 mL | Freq: Once | INTRAMUSCULAR | Status: AC | PRN
  Administered 2012-10-24: 100 mL via INTRAVENOUS

## 2012-10-29 ENCOUNTER — Telehealth: Payer: Self-pay

## 2012-10-29 ENCOUNTER — Ambulatory Visit (HOSPITAL_BASED_OUTPATIENT_CLINIC_OR_DEPARTMENT_OTHER): Payer: Medicare Other | Admitting: Oncology

## 2012-10-29 ENCOUNTER — Ambulatory Visit: Payer: Medicare Other | Admitting: Hematology and Oncology

## 2012-10-29 VITALS — BP 161/89 | HR 69 | Temp 98.5°F | Resp 18 | Ht 62.0 in | Wt 150.2 lb

## 2012-10-29 DIAGNOSIS — C189 Malignant neoplasm of colon, unspecified: Secondary | ICD-10-CM

## 2012-10-29 DIAGNOSIS — K649 Unspecified hemorrhoids: Secondary | ICD-10-CM

## 2012-10-29 DIAGNOSIS — C20 Malignant neoplasm of rectum: Secondary | ICD-10-CM

## 2012-10-29 DIAGNOSIS — C787 Secondary malignant neoplasm of liver and intrahepatic bile duct: Secondary | ICD-10-CM

## 2012-10-29 NOTE — Telephone Encounter (Signed)
Gave pt appt for lab and MD on November and March 2014

## 2012-10-29 NOTE — Progress Notes (Signed)
   [facility]    OFFICE PROGRESS NOTE   INTERVAL HISTORY:   She returns as scheduled. She has been followed by Dr. Dalene Carrow since being diagnosed with rectal cancer in 2007. She reports feeling well. She has intermittent "hemorrhoid "discomfort that was exacerbated after the CT last week. She reports irregular bowel habits since undergoing treatment for rectal cancer. No bleeding. Good appetite.  Objective:  Vital signs in last 24 hours:  Blood pressure 161/89, pulse 69, temperature 98.5 F (36.9 C), temperature source Oral, resp. rate 18, height 5\' 2"  (1.575 m), weight 150 lb 3.2 oz (68.13 kg).    HEENT: Neck without mass Lymphatics: No cervical, supra-clavicular, axillary, or inguinal nodes Resp: Lungs clear bilaterally Cardio: Regular rate and rhythm GI: No hepatomegaly, nontender, no mass, scars at biopsy sites surrounding the anal verge. No visible mass. Small (4-5 millimeter) hemorrhoid versus soft polypoid lesion at the 6:00 position of the anal verge (she reports this has been present chronically) Vascular:  No leg edema   Lab   Lab Results  Component Value Date   WBC 4.7 10/24/2012   HGB 14.2 10/24/2012   HCT 41.7 10/24/2012   MCV 100.3 10/24/2012   PLT 176 10/24/2012   ANC 3.3  CEA-5.7  X-rays:CTs of the chest, abdomen, and pelvis on 10/24/2012-no evidence of metastatic disease in the chest, status post partial right hepatectomy and left hepatic wedge resection, no suspicious liver lesions, no evidence of recurrent or metastatic disease in the abdomen or pelvis.   Medications: I have reviewed the patient's current medications.  Assessment/Plan:  1. Rectal cancer-stage IV, status post infusional 5-FU/radiation beginning 02/12/2006, status post a low anterior resection with loop ileostomy and liver biopsy on 05/02/2006  2. Liver resection at [facility] 06/21/2006  3. Loop ileostomy closure November 2008  4. Status post treatment of anal  condylomata by Dr. Abbey Chatters December 2012  Disposition:  She remains in clinical remission from rectal cancer. The CEA has been mildly elevated in the past. She will return for a CEA in 3 months. She is scheduled for a 6 month office visit. We will be sure she is up-to-date on colon cancer screening with Dr. Loreta Ave. She will seek medical attention if the "hemorrhoids "progress or she develops new findings at the anus.   Thornton Papas, MD  10/29/2012  1:46 PM

## 2013-01-29 ENCOUNTER — Other Ambulatory Visit: Payer: Medicare Other

## 2013-01-29 DIAGNOSIS — C189 Malignant neoplasm of colon, unspecified: Secondary | ICD-10-CM

## 2013-01-31 ENCOUNTER — Telehealth: Payer: Self-pay | Admitting: *Deleted

## 2013-01-31 NOTE — Telephone Encounter (Signed)
Informed patient of stable CEA and also to repeat labs in 3 months.  Per Dr. Truett Perna.  Patient verbalized understanding.

## 2013-01-31 NOTE — Telephone Encounter (Signed)
Message copied by Raphael Gibney on Fri Jan 31, 2013  1:17 PM ------      Message from: Wandalee Ferdinand      Created: Fri Jan 31, 2013 12:56 PM                   ----- Message -----         From: Ladene Artist, MD         Sent: 01/30/2013   3:03 PM           To: Wandalee Ferdinand, RN, Glori Luis, RN, #            Please call patient, cea is stable, repeat 56mo. ------

## 2013-01-31 NOTE — Telephone Encounter (Signed)
Left message for patient to call back for lab results.  Per Dr. Truett Perna.

## 2013-04-29 ENCOUNTER — Telehealth: Payer: Self-pay | Admitting: Oncology

## 2013-04-29 NOTE — Telephone Encounter (Signed)
called pt left message regarding appt r/s to March

## 2013-05-02 ENCOUNTER — Telehealth: Payer: Self-pay | Admitting: Oncology

## 2013-05-02 NOTE — Telephone Encounter (Signed)
Talked to pt she is aware of appt for lab and MD for [facility]

## 2013-05-02 NOTE — Telephone Encounter (Signed)
pt called today to confirm next appt. s/w pt re next appt for 3/16 and mailed schedule.

## 2013-05-15 ENCOUNTER — Other Ambulatory Visit: Payer: Medicare Other

## 2013-05-19 ENCOUNTER — Ambulatory Visit: Payer: Medicare Other | Admitting: Oncology

## 2013-05-26 ENCOUNTER — Other Ambulatory Visit (HOSPITAL_BASED_OUTPATIENT_CLINIC_OR_DEPARTMENT_OTHER): Payer: Medicare HMO

## 2013-05-26 ENCOUNTER — Telehealth: Payer: Self-pay | Admitting: Oncology

## 2013-05-26 ENCOUNTER — Ambulatory Visit (HOSPITAL_BASED_OUTPATIENT_CLINIC_OR_DEPARTMENT_OTHER): Payer: Medicare HMO | Admitting: Nurse Practitioner

## 2013-05-26 VITALS — BP 169/84 | HR 67 | Temp 97.9°F | Resp 20 | Ht 62.0 in | Wt 144.2 lb

## 2013-05-26 DIAGNOSIS — C20 Malignant neoplasm of rectum: Secondary | ICD-10-CM | POA: Diagnosis not present

## 2013-05-26 DIAGNOSIS — C189 Malignant neoplasm of colon, unspecified: Secondary | ICD-10-CM

## 2013-05-26 DIAGNOSIS — K649 Unspecified hemorrhoids: Secondary | ICD-10-CM | POA: Diagnosis not present

## 2013-05-26 NOTE — Progress Notes (Signed)
OFFICE PROGRESS NOTE  Interval history:  Heather Rasmussen returns for followup of rectal cancer. She reports continued "issues with hemorrhoids". The issues include pain with bowel movement and bleeding with wiping for greater than 2 months. She has intermittent abdominal cramps. She reports a good appetite. No nausea or vomiting.   Objective: Filed Vitals:   05/26/13 1331  BP: 169/84  Pulse: 67  Temp: 97.9 F (36.6 C)  Resp: 20   Oropharynx is without thrush or ulceration. No palpable cervical, supraclavicular, axillary or inguinal lymph nodes. Lungs are clear. Regular cardiac rhythm. Abdomen soft and nontender. No hepatomegaly. No leg edema. Question hemorrhoid versus polypoid lesion at the anterior anal verge. No rectal mass. Stool soft, brown.   Lab Results: Lab Results  Component Value Date   WBC 4.7 10/24/2012   HGB 14.2 10/24/2012   HCT 41.7 10/24/2012   MCV 100.3 10/24/2012   PLT 176 10/24/2012   NEUTROABS 3.3 10/24/2012    Chemistry:    Chemistry      Component Value Date/Time   NA 141 10/24/2012 0849   NA 139 04/11/2011 1307   NA 139 10/04/2010 0812   K 4.2 10/24/2012 0849   K 4.4 04/11/2011 1307   K 4.4 10/04/2010 0812   CL 110* 01/15/2012 1003   CL 104 04/11/2011 1307   CL 102 10/04/2010 0812   CO2 22 10/24/2012 0849   CO2 23 04/11/2011 1307   CO2 29 10/04/2010 0812   BUN 17.7 10/24/2012 0849   BUN 16 04/11/2011 1307   BUN 21 10/04/2010 0812   CREATININE 1.1 10/24/2012 0849   CREATININE 1.19* 04/11/2011 1307   CREATININE 1.3* 10/04/2010 0812      Component Value Date/Time   CALCIUM 9.9 10/24/2012 0849   CALCIUM 9.7 04/11/2011 1307   CALCIUM 8.9 10/04/2010 0812   ALKPHOS 106 10/24/2012 0849   ALKPHOS 82 04/11/2011 1307   ALKPHOS 62 10/04/2010 0812   AST 46* 10/24/2012 0849   AST 38* 04/11/2011 1307   AST 26 10/04/2010 0812   ALT 49 10/24/2012 0849   ALT 27 04/11/2011 1307   ALT 28 10/04/2010 0812   BILITOT 0.53 10/24/2012 0849   BILITOT 0.6 04/11/2011 1307   BILITOT 0.60 10/04/2010  0812       Studies/Results: No results found.  Medications: I have reviewed the patient's current medications.  Assessment/Plan: 1. Rectal cancer-stage IV, status post infusional 5-FU/radiation beginning 02/12/2006, status post a low anterior resection with loop ileostomy and liver biopsy on 05/02/2006.  2. Liver resection at [facility] 06/21/2006.  3. Loop ileostomy closure November 2008.  4. 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 5. "Hemorrhoids" with pain and bleeding.  Dispositon-she remains in clinical remission from rectal cancer. We will followup on the CEA from today. We made a referral to Dr. Collene Mares for evaluation of question hemorrhoids, pain with bowel movements and bleeding.  She will return for a followup visit and CEA in 6 months. Sooner if needed.  Plan reviewed with Dr. Benay Spice.   Ned Card ANP/GNP-BC

## 2013-05-26 NOTE — Telephone Encounter (Signed)
gv and printed appt sched for March and Sept..Marland KitchenMarland KitchenPt sched to see Dr. Collene Mares on 3.19.15 @2 :30pm

## 2013-05-27 ENCOUNTER — Telehealth: Payer: Self-pay | Admitting: Oncology

## 2013-05-27 LAB — CEA: CEA: 4.3 ng/mL (ref 0.0–5.0)

## 2013-05-27 NOTE — Telephone Encounter (Signed)
Faxed pt medical records to Dr. Mann °

## 2013-05-28 ENCOUNTER — Telehealth: Payer: Self-pay | Admitting: *Deleted

## 2013-05-28 NOTE — Telephone Encounter (Signed)
Called and informed patient of normal cea and to follow up as scheduled. Per Dr. Sherrill.  Patient verbalized understanding.  

## 2013-05-28 NOTE — Telephone Encounter (Signed)
Message copied by Norma Fredrickson on Wed May 28, 2013  9:33 AM ------      Message from: Heather Rasmussen      Created: Tue May 27, 2013  8:03 PM       Please call patient, cea is normal, f/u as scheduled ------

## 2013-11-27 ENCOUNTER — Ambulatory Visit (HOSPITAL_BASED_OUTPATIENT_CLINIC_OR_DEPARTMENT_OTHER): Payer: Medicare HMO | Admitting: Oncology

## 2013-11-27 ENCOUNTER — Telehealth: Payer: Self-pay | Admitting: Oncology

## 2013-11-27 ENCOUNTER — Encounter: Payer: Self-pay | Admitting: *Deleted

## 2013-11-27 ENCOUNTER — Other Ambulatory Visit: Payer: Commercial Managed Care - HMO

## 2013-11-27 VITALS — BP 165/65 | HR 49 | Temp 98.4°F | Resp 18 | Ht 62.0 in | Wt 158.1 lb

## 2013-11-27 DIAGNOSIS — R159 Full incontinence of feces: Secondary | ICD-10-CM | POA: Diagnosis not present

## 2013-11-27 DIAGNOSIS — C787 Secondary malignant neoplasm of liver and intrahepatic bile duct: Secondary | ICD-10-CM | POA: Diagnosis not present

## 2013-11-27 DIAGNOSIS — C20 Malignant neoplasm of rectum: Secondary | ICD-10-CM | POA: Diagnosis not present

## 2013-11-27 NOTE — Progress Notes (Signed)
[place] Work  Clinical Social Work was referred by nurse for assessment of psychosocial needs due to possible depression and feelings of hopelessness.  Clinical Social Worker met with patient at [facility] after her appt to offer support and assess for needs.  Pt reports recent loss of her mother in May of this year and strained relations with her daughter, Phineas Real due to heroin use. Pt reports she has good support from her cousin and her friend that both visit often. Pt does appear to be slightly depressed and reports to being tearful and not sleeping well. She denies h/o depression and thoughts of suicide. CSW discussed grief reaction/emotions and coping techniques. CSW provided pt with local counseling resources as well as grief counseling resources. CSW also provided pt with substance abuse resources to assist with her daughter. Pt was very appreciative and agrees to reach out to CSW as needed.   Clinical Social Work interventions: Grief education Sub abuse education Resources  Loren Racer, [place] Worker [place]  [place] Phone: [phone] Fax: [phone]

## 2013-11-27 NOTE — Progress Notes (Signed)
  [place] OFFICE PROGRESS NOTE   Diagnosis:  Rectal cancer  INTERVAL HISTORY:   Heather Rasmussen returns as scheduled. She continues to experience fecal incontinence which has been an ongoing problem since surgery. No rectal bleeding or pain with bowel movements. She has a good appetite. No shortness of breath or cough. No hematuria or dysuria. She reports she will be undergoing a colonoscopy later this year.  She was tearful during today's visit. She states she is depressed. She denies suicidal ideation. Her mother died in Aug 10, 2022 of this year with COPD and alcoholism. Heather Rasmussen daughter is 102 years old and has drug addiction issues. Her main support is a cousin that lives nearby. No other close family members. She does have many friends.  Objective:  Vital signs in last 24 hours:  Blood pressure 165/65, pulse 49, temperature 98.4 F (36.9 C), temperature source Oral, resp. rate 18, height 5\' 2"  (1.575 m), weight 158 lb 1.6 oz (71.714 kg).    HEENT: No thrush or ulcers. Lymphatics: No palpable cervical, supraclavicular, axillary or inguinal lymph nodes. Resp: Lungs clear bilaterally. Cardio: Regular rate and rhythm. GI: Abdomen is soft, mildly tender at the right upper quadrant. No hepatomegaly. No mass. Vascular: No leg edema.  Rectal: Soft tissue at the anterior anal verge, question skin tag. No rectal mass.    Lab Results:  Lab Results  Component Value Date   WBC 4.7 10/24/2012   HGB 14.2 10/24/2012   HCT 41.7 10/24/2012   MCV 100.3 10/24/2012   PLT 176 10/24/2012   NEUTROABS 3.3 10/24/2012   CEA pending. Imaging:  No results found.  Medications: I have reviewed the patient's current medications.  Assessment/Plan: 1. Rectal cancer-stage IV, status post infusional 5-FU/radiation beginning 02/12/2006, status post a low anterior resection with loop ileostomy and liver biopsy on 05/02/2006.  2. Liver resection at [facility] 06/21/2006.  3. Loop ileostomy  closure November 2008.  4. 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  5. Fecal incontinence, chronic since surgery.  Disposition: Heather Rasmussen remains in clinical remission from rectal cancer. We will followup on the CEA from today. She reports she is scheduled to undergo a colonoscopy with Dr. Collene Mares later this year.   We discussed a referral to physical therapy for the fecal incontinence. She would like to proceed with this. A referral was made to [name], PT.  Our social worker is meeting with her today regarding depression, counseling services.   She will return for a followup visit and CEA in 6 months. She will contact the office in the interim with any problems.  Plan reviewed with Dr. Benay Spice. 25 minutes were spent face-to-face at today's visit with the majority of that time involved in counseling/coordination of care.    Ned Card ANP/GNP-BC   11/27/2013  11:34 AM

## 2013-11-27 NOTE — Telephone Encounter (Signed)
gv adn prnted appt sched and avs for pt for March 2016.Marland KitchenMarland KitchenMarland KitchenMarland Kitchenpt did not want to sched pt at this time

## 2013-11-28 ENCOUNTER — Telehealth: Payer: Self-pay | Admitting: *Deleted

## 2013-11-28 LAB — CEA: CEA: 4.2 ng/mL (ref 0.0–5.0)

## 2013-11-28 NOTE — Telephone Encounter (Signed)
Message copied by Wardell Heath on Fri Nov 28, 2013  3:07 PM ------      Message from: [place], [place] K      Created: Fri Nov 28, 2013  2:00 PM       Please let her know CEA is in normal range. ------

## 2013-11-28 NOTE — Telephone Encounter (Signed)
Called and informed patient normal CEA.  Per Heather Rasmussen. Heather Rasmussen.  Patient verbalized understanding.

## 2014-02-19 ENCOUNTER — Encounter: Payer: Self-pay | Admitting: Oncology

## 2014-04-28 ENCOUNTER — Telehealth: Payer: Self-pay | Admitting: Oncology

## 2014-04-28 NOTE — Telephone Encounter (Signed)
returned call and s.w. pt and confirm March appt.Marland KitchenMarland KitchenMarland KitchenMarland Kitchenpt ok and aware

## 2014-05-28 ENCOUNTER — Other Ambulatory Visit: Payer: Medicare HMO

## 2014-05-28 ENCOUNTER — Ambulatory Visit (HOSPITAL_BASED_OUTPATIENT_CLINIC_OR_DEPARTMENT_OTHER): Payer: Medicare HMO | Admitting: Oncology

## 2014-05-28 VITALS — BP 196/66 | HR 61 | Temp 97.6°F | Resp 19 | Ht 62.0 in | Wt 157.7 lb

## 2014-05-28 DIAGNOSIS — C20 Malignant neoplasm of rectum: Secondary | ICD-10-CM

## 2014-05-28 DIAGNOSIS — C189 Malignant neoplasm of colon, unspecified: Secondary | ICD-10-CM

## 2014-05-28 DIAGNOSIS — R159 Full incontinence of feces: Secondary | ICD-10-CM

## 2014-05-28 NOTE — Progress Notes (Signed)
  [place] OFFICE PROGRESS NOTE   Diagnosis: Rectal cancer  INTERVAL HISTORY:   Heather Rasmussen returns as scheduled. She continues to have irregular bowel habits and fecal incontinence. No bleeding. She reports having a colonoscopy with Dr. Collene Mares last year. She has not been evaluated in the physical therapy clinic.  Objective:  Vital signs in last 24 hours:  Blood pressure 196/66, pulse 61, temperature 97.6 F (36.4 C), temperature source Oral, resp. rate 19, height 5\' 2"  (1.575 m), weight 157 lb 11.2 oz (71.532 kg), SpO2 99 %.    HEENT: Neck without mass Lymphatics: No cervical, supra-clavicular, axillary, or inguinal nodes Resp: Lungs clear bilaterally Cardio: Regular rate and rhythm GI: No hepatomegaly, mild tenderness at the right upper quadrant incision, no mass Vascular: No leg edema   Lab Results  Component Value Date   CEA 4.2 11/27/2013    Medications: I have reviewed the patient's current medications.  Assessment/Plan: 1. Rectal cancer-stage IV, status post infusional 5-FU/radiation beginning 02/12/2006, status post a low anterior resection with loop ileostomy and liver biopsy on 05/02/2006.  2. Liver resection at [facility] 06/21/2006.  3. Loop ileostomy closure November 2008.  4. 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  5. Fecal incontinence, chronic since surgery   Disposition:  She remains in clinical remission from rectal cancer. We will follow-up on the CEA from today and obtain the colonoscopy report from Dr. Collene Mares. She will be referred to the pelvic physical therapy clinic. [name] will return for an office visit and CEA in 6 months.  Betsy Coder, MD  05/28/2014  2:39 PM

## 2014-05-29 ENCOUNTER — Telehealth: Payer: Self-pay | Admitting: *Deleted

## 2014-05-29 LAB — CEA: CEA: 4.4 ng/mL (ref 0.0–5.0)

## 2014-05-29 NOTE — Telephone Encounter (Signed)
-----   Message from Ladell Pier, MD sent at 05/29/2014  3:09 PM EDT ----- Please call patient, cea is normal

## 2014-06-02 ENCOUNTER — Telehealth: Payer: Self-pay | Admitting: *Deleted

## 2014-06-02 NOTE — Telephone Encounter (Signed)
Requested colonoscopy report from Dr. Lorie Apley office.

## 2014-06-03 ENCOUNTER — Telehealth: Payer: Self-pay | Admitting: *Deleted

## 2014-06-03 NOTE — Telephone Encounter (Signed)
Patient called requesting CEA results.  Since the CEA was normal, informed patient that CEA of same.  Patient assured me she is making an appointment with her PCP regarding elevated blood pressure.

## 2014-11-26 ENCOUNTER — Telehealth: Payer: Self-pay | Admitting: Oncology

## 2014-11-26 ENCOUNTER — Ambulatory Visit (HOSPITAL_BASED_OUTPATIENT_CLINIC_OR_DEPARTMENT_OTHER): Payer: Medicare HMO | Admitting: Nurse Practitioner

## 2014-11-26 ENCOUNTER — Other Ambulatory Visit: Payer: Medicare HMO

## 2014-11-26 ENCOUNTER — Other Ambulatory Visit: Payer: Self-pay | Admitting: *Deleted

## 2014-11-26 VITALS — BP 176/87 | HR 64 | Temp 97.7°F | Resp 17 | Ht 62.0 in | Wt 164.5 lb

## 2014-11-26 DIAGNOSIS — C20 Malignant neoplasm of rectum: Secondary | ICD-10-CM | POA: Diagnosis not present

## 2014-11-26 DIAGNOSIS — C189 Malignant neoplasm of colon, unspecified: Secondary | ICD-10-CM

## 2014-11-26 LAB — CEA: CEA: 3.9 ng/mL (ref 0.0–5.0)

## 2014-11-26 NOTE — Telephone Encounter (Signed)
Gave and printed appt sched and avs for pt for March 2017 °

## 2014-11-26 NOTE — Progress Notes (Addendum)
  [place] OFFICE PROGRESS NOTE   Diagnosis:  Rectal cancer  INTERVAL HISTORY:   Ms. Willhoite returns as scheduled. She overall feels well. She continues to have erratic/irregular bowel habits. She has intermittent fecal incontinence. She has not been evaluated in the physical therapy clinic. No rectal bleeding. She has a good appetite. She denies pain. No nausea or vomiting. She reports she is working with her PCP regarding her blood pressure.  Objective:  Vital signs in last 24 hours:  Blood pressure 176/87, pulse 64, temperature 97.7 F (36.5 C), temperature source Oral, resp. rate 17, height 5\' 2"  (1.575 m), weight 164 lb 8 oz (74.617 kg), SpO2 100 %.    HEENT: No thrush or ulcers. Lymphatics: No palpable cervical, supra clavicular, axillary or inguinal lymph nodes. Resp: Lungs clear bilaterally. Cardio: Regular rate and rhythm. GI: Abdomen soft and nontender. No hepatomegaly. No mass. Vascular: No leg edema.   Lab Results:  Lab Results  Component Value Date   WBC 4.7 10/24/2012   HGB 14.2 10/24/2012   HCT 41.7 10/24/2012   MCV 100.3 10/24/2012   PLT 176 10/24/2012   NEUTROABS 3.3 10/24/2012    Imaging:  No results found.  Medications: I have reviewed the patient's current medications.  Assessment/Plan: 1. Rectal cancer-stage IV, status post infusional 5-FU/radiation beginning 02/12/2006, status post a low anterior resection with loop ileostomy and liver biopsy on 05/02/2006.  2. Liver resection at [facility] 06/21/2006.  3. Loop ileostomy closure November 2008.  4. 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  5. Fecal incontinence, chronic since surgery 6. Screening colonoscopy 01/07/2014. Diminutive polyp found in the rectum. Some bleeding at the anastomosis. Diminutive polyp in the proximal ascending colon. Pathology on rectal polyp showed mucosal prolapse-type polyp. No dysplasia or malignancy identified.  Next colonoscopy at a five-year interval.   Disposition: Ms. Canedo remains in clinical remission from rectal cancer. We will follow-up on the CEA from today. We will make a referral to the pelvic physical therapy clinic. She will return for a follow-up visit and CEA in 6 months.    Ned Card ANP/GNP-BC   11/26/2014  10:09 AM

## 2014-11-27 ENCOUNTER — Telehealth: Payer: Self-pay | Admitting: *Deleted

## 2014-11-27 NOTE — Telephone Encounter (Signed)
-----   Message from Ladell Pier, MD sent at 11/27/2014  1:17 AM EDT ----- Please call patient, CEA is normal

## 2014-11-27 NOTE — Telephone Encounter (Signed)
Per Dr. Sherrill; notified pt that cea is normal.  Pt verbalized understanding and expressed appreciation for call. 

## 2014-12-08 ENCOUNTER — Ambulatory Visit: Payer: Medicare HMO | Admitting: Physical Therapy

## 2014-12-09 ENCOUNTER — Ambulatory Visit: Payer: Medicare HMO | Attending: Nurse Practitioner | Admitting: Physical Therapy

## 2015-01-27 ENCOUNTER — Ambulatory Visit: Payer: Medicare HMO | Attending: General Surgery | Admitting: Physical Therapy

## 2015-04-08 ENCOUNTER — Telehealth: Payer: Self-pay | Admitting: Oncology

## 2015-04-08 NOTE — Telephone Encounter (Signed)
Pt called to confirm labs/ov per staff scheduler..... KJ

## 2015-05-21 ENCOUNTER — Ambulatory Visit: Payer: Medicare HMO | Admitting: Oncology

## 2015-05-21 ENCOUNTER — Other Ambulatory Visit: Payer: Medicare HMO

## 2015-05-24 ENCOUNTER — Telehealth: Payer: Self-pay | Admitting: Oncology

## 2015-05-24 NOTE — Telephone Encounter (Signed)
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

## 2015-05-27 ENCOUNTER — Other Ambulatory Visit: Payer: Medicare HMO

## 2015-05-27 ENCOUNTER — Ambulatory Visit: Payer: Medicare HMO | Admitting: Oncology

## 2016-03-22 DIAGNOSIS — Z113 Encounter for screening for infections with a predominantly sexual mode of transmission: Secondary | ICD-10-CM | POA: Diagnosis not present

## 2016-03-22 DIAGNOSIS — N763 Subacute and chronic vulvitis: Secondary | ICD-10-CM | POA: Diagnosis not present

## 2016-04-10 DIAGNOSIS — Z1231 Encounter for screening mammogram for malignant neoplasm of breast: Secondary | ICD-10-CM | POA: Diagnosis not present

## 2016-06-14 DIAGNOSIS — I10 Essential (primary) hypertension: Secondary | ICD-10-CM | POA: Diagnosis not present

## 2016-06-14 DIAGNOSIS — E782 Mixed hyperlipidemia: Secondary | ICD-10-CM | POA: Diagnosis not present

## 2016-06-14 DIAGNOSIS — Z1159 Encounter for screening for other viral diseases: Secondary | ICD-10-CM | POA: Diagnosis not present

## 2016-07-05 DIAGNOSIS — Z9119 Patient's noncompliance with other medical treatment and regimen: Secondary | ICD-10-CM | POA: Diagnosis not present

## 2016-07-05 DIAGNOSIS — I10 Essential (primary) hypertension: Secondary | ICD-10-CM | POA: Diagnosis not present

## 2016-07-05 DIAGNOSIS — E782 Mixed hyperlipidemia: Secondary | ICD-10-CM | POA: Diagnosis not present

## 2016-07-05 DIAGNOSIS — C20 Malignant neoplasm of rectum: Secondary | ICD-10-CM | POA: Diagnosis not present

## 2016-07-05 DIAGNOSIS — F329 Major depressive disorder, single episode, unspecified: Secondary | ICD-10-CM | POA: Diagnosis not present

## 2016-07-05 DIAGNOSIS — Z85038 Personal history of other malignant neoplasm of large intestine: Secondary | ICD-10-CM | POA: Diagnosis not present

## 2016-10-12 DIAGNOSIS — I10 Essential (primary) hypertension: Secondary | ICD-10-CM | POA: Diagnosis not present

## 2016-10-12 DIAGNOSIS — E782 Mixed hyperlipidemia: Secondary | ICD-10-CM | POA: Diagnosis not present

## 2016-11-01 ENCOUNTER — Telehealth: Payer: Self-pay | Admitting: Oncology

## 2016-11-01 NOTE — Telephone Encounter (Signed)
Spoke with patient regarding her appointment on 10/8.

## 2016-12-15 ENCOUNTER — Other Ambulatory Visit: Payer: Self-pay | Admitting: *Deleted

## 2016-12-15 DIAGNOSIS — C20 Malignant neoplasm of rectum: Secondary | ICD-10-CM

## 2016-12-18 ENCOUNTER — Telehealth: Payer: Self-pay | Admitting: Nurse Practitioner

## 2016-12-18 ENCOUNTER — Other Ambulatory Visit (HOSPITAL_BASED_OUTPATIENT_CLINIC_OR_DEPARTMENT_OTHER): Payer: PPO

## 2016-12-18 ENCOUNTER — Ambulatory Visit (HOSPITAL_BASED_OUTPATIENT_CLINIC_OR_DEPARTMENT_OTHER): Payer: PPO | Admitting: Nurse Practitioner

## 2016-12-18 VITALS — BP 169/81 | HR 87 | Temp 98.4°F | Resp 18 | Ht 62.0 in | Wt 150.6 lb

## 2016-12-18 DIAGNOSIS — R194 Change in bowel habit: Secondary | ICD-10-CM | POA: Diagnosis not present

## 2016-12-18 DIAGNOSIS — C20 Malignant neoplasm of rectum: Secondary | ICD-10-CM

## 2016-12-18 DIAGNOSIS — R05 Cough: Secondary | ICD-10-CM

## 2016-12-18 DIAGNOSIS — Z85038 Personal history of other malignant neoplasm of large intestine: Secondary | ICD-10-CM | POA: Diagnosis not present

## 2016-12-18 DIAGNOSIS — R634 Abnormal weight loss: Secondary | ICD-10-CM | POA: Diagnosis not present

## 2016-12-18 LAB — CEA (IN HOUSE-CHCC): CEA (CHCC-In House): 4.68 ng/mL (ref 0.00–5.00)

## 2016-12-18 NOTE — Telephone Encounter (Signed)
Scheduled appt per 10/8 los - Gave patient AVS and calender per los.  

## 2016-12-18 NOTE — Progress Notes (Signed)
  [place] OFFICE PROGRESS NOTE   Diagnosis:  Rectal cancer  INTERVAL HISTORY:   Heather Rasmussen was last seen at the [facility] 11/26/2014. She did not return for subsequent follow-up visits. Bowel habits continue be erratic, unpredictable. She denies rectal bleeding. She reports she is due for a colonoscopy. She has occasional pain at the right abdomen. No areas of consistent pain. She reports a good appetite. A few days ago she noted a sore throat, sneezing and a runny nose. She has a minimal cough. No shortness of breath. She thinks she may have had a fever.  Objective:  Vital signs in last 24 hours:  Blood pressure (!) 169/81, pulse 87, temperature 98.4 F (36.9 C), temperature source Oral, resp. rate 18, height 5\' 2"  (1.575 m), weight 150 lb 9.6 oz (68.3 kg), SpO2 100 %.    HEENT: Neck without mass. Lymphatics: No palpable cervical, supra clavicular, axillary or inguinal lymph nodes. Resp: Lungs clear bilaterally. Cardio: Regular rate and rhythm. GI: Abdomen soft and nontender. No hepatomegaly. No mass. Vascular: No leg edema.   Lab Results:  Lab Results  Component Value Date   WBC 4.7 10/24/2012   HGB 14.2 10/24/2012   HCT 41.7 10/24/2012   MCV 100.3 10/24/2012   PLT 176 10/24/2012   NEUTROABS 3.3 10/24/2012    Imaging:  No results found.  Medications: I have reviewed the patient's current medications.  Assessment/Plan: 1. Rectal cancer-stage IV, status post infusional 5-FU/radiation beginning 02/12/2006, status post a low anterior resection with loop ileostomy and liver biopsy on 05/02/2006.  2. Liver resection at [facility] 06/21/2006.  3. Loop ileostomy closure November 2008.  4. 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  5. Fecal incontinence, chronic since surgery 6. Screening colonoscopy 01/07/2014. Diminutive polyp found in the rectum. Some bleeding at the anastomosis. Diminutive polyp in the  proximal ascending colon. Pathology on rectal polyp showed mucosal prolapse-type polyp. No dysplasia or malignancy identified. Next colonoscopy at a five-year interval.   Disposition: Heather Rasmussen remains in clinical remission from rectal cancer. We will follow-up on the CEA from today.   She thinks she is due for a colonoscopy. Our records indicate the next colonoscopy is due in 2020. We made a referral to Dr. Lorie Apley office to question timing of her next colonoscopy.  She has lost weight over the past 2 years. She reports a good appetite. She will contact us or her PCP with continued weight loss.  At present she appears to have a viral upper respiratory infection. She will treat symptomatically and contact her PCP with worsening symptoms.  She will return for a follow-up visit and CEA in one year. She will contact the office in the interim as outlined above or with any other problems.  Plan reviewed with Dr. Benay Spice.    Ned Card ANP/GNP-BC   12/18/2016  11:55 AM

## 2016-12-19 ENCOUNTER — Telehealth: Payer: Self-pay | Admitting: Emergency Medicine

## 2016-12-19 NOTE — Telephone Encounter (Addendum)
Pt verbalized understanding of this message.   ----- Message from Owens Shark, NP sent at 12/19/2016  8:49 AM EDT ----- Please let her know CEA is in normal range.

## 2017-01-22 DIAGNOSIS — I10 Essential (primary) hypertension: Secondary | ICD-10-CM | POA: Diagnosis not present

## 2017-01-22 DIAGNOSIS — E782 Mixed hyperlipidemia: Secondary | ICD-10-CM | POA: Diagnosis not present

## 2017-01-22 DIAGNOSIS — Z131 Encounter for screening for diabetes mellitus: Secondary | ICD-10-CM | POA: Diagnosis not present

## 2017-02-12 DIAGNOSIS — Z23 Encounter for immunization: Secondary | ICD-10-CM | POA: Diagnosis not present

## 2017-02-12 DIAGNOSIS — R946 Abnormal results of thyroid function studies: Secondary | ICD-10-CM | POA: Diagnosis not present

## 2017-02-12 DIAGNOSIS — I10 Essential (primary) hypertension: Secondary | ICD-10-CM | POA: Diagnosis not present

## 2017-02-12 DIAGNOSIS — Z85038 Personal history of other malignant neoplasm of large intestine: Secondary | ICD-10-CM | POA: Diagnosis not present

## 2017-02-12 DIAGNOSIS — C20 Malignant neoplasm of rectum: Secondary | ICD-10-CM | POA: Diagnosis not present

## 2017-05-23 DIAGNOSIS — E782 Mixed hyperlipidemia: Secondary | ICD-10-CM | POA: Diagnosis not present

## 2017-05-23 DIAGNOSIS — I10 Essential (primary) hypertension: Secondary | ICD-10-CM | POA: Diagnosis not present

## 2017-05-30 DIAGNOSIS — I10 Essential (primary) hypertension: Secondary | ICD-10-CM | POA: Diagnosis not present

## 2017-05-30 DIAGNOSIS — E782 Mixed hyperlipidemia: Secondary | ICD-10-CM | POA: Diagnosis not present

## 2017-05-30 DIAGNOSIS — Z1231 Encounter for screening mammogram for malignant neoplasm of breast: Secondary | ICD-10-CM | POA: Diagnosis not present

## 2017-05-30 DIAGNOSIS — F329 Major depressive disorder, single episode, unspecified: Secondary | ICD-10-CM | POA: Diagnosis not present

## 2017-06-04 DIAGNOSIS — Z1231 Encounter for screening mammogram for malignant neoplasm of breast: Secondary | ICD-10-CM | POA: Diagnosis not present

## 2017-07-24 ENCOUNTER — Telehealth: Payer: Self-pay | Admitting: *Deleted

## 2017-07-24 NOTE — Telephone Encounter (Signed)
Received disability forms in the mail from pt. Reviewed with Dr. Benay Spice; pt is not disabled due to her cancer diagnosis. Called pt, she requested we mail forms back to her.  Same done.

## 2017-12-18 ENCOUNTER — Other Ambulatory Visit: Payer: PPO

## 2017-12-18 ENCOUNTER — Ambulatory Visit: Payer: PPO | Admitting: Oncology

## 2018-03-14 DIAGNOSIS — A599 Trichomoniasis, unspecified: Secondary | ICD-10-CM | POA: Diagnosis not present

## 2018-03-14 DIAGNOSIS — I10 Essential (primary) hypertension: Secondary | ICD-10-CM | POA: Diagnosis not present

## 2018-03-14 DIAGNOSIS — N898 Other specified noninflammatory disorders of vagina: Secondary | ICD-10-CM | POA: Diagnosis not present

## 2018-03-14 DIAGNOSIS — N760 Acute vaginitis: Secondary | ICD-10-CM | POA: Diagnosis not present

## 2018-04-10 DIAGNOSIS — Z1321 Encounter for screening for nutritional disorder: Secondary | ICD-10-CM | POA: Diagnosis not present

## 2018-04-10 DIAGNOSIS — R946 Abnormal results of thyroid function studies: Secondary | ICD-10-CM | POA: Diagnosis not present

## 2018-04-10 DIAGNOSIS — E782 Mixed hyperlipidemia: Secondary | ICD-10-CM | POA: Diagnosis not present

## 2018-04-10 DIAGNOSIS — Z9119 Patient's noncompliance with other medical treatment and regimen: Secondary | ICD-10-CM | POA: Diagnosis not present

## 2018-04-10 DIAGNOSIS — D7589 Other specified diseases of blood and blood-forming organs: Secondary | ICD-10-CM | POA: Diagnosis not present

## 2018-04-10 DIAGNOSIS — C787 Secondary malignant neoplasm of liver and intrahepatic bile duct: Secondary | ICD-10-CM | POA: Diagnosis not present

## 2018-04-10 DIAGNOSIS — C20 Malignant neoplasm of rectum: Secondary | ICD-10-CM | POA: Diagnosis not present

## 2018-04-10 DIAGNOSIS — Z0000 Encounter for general adult medical examination without abnormal findings: Secondary | ICD-10-CM | POA: Diagnosis not present

## 2018-04-10 DIAGNOSIS — I10 Essential (primary) hypertension: Secondary | ICD-10-CM | POA: Diagnosis not present

## 2018-04-10 DIAGNOSIS — F329 Major depressive disorder, single episode, unspecified: Secondary | ICD-10-CM | POA: Diagnosis not present

## 2018-04-10 DIAGNOSIS — Z23 Encounter for immunization: Secondary | ICD-10-CM | POA: Diagnosis not present

## 2018-04-10 DIAGNOSIS — Z87891 Personal history of nicotine dependence: Secondary | ICD-10-CM | POA: Diagnosis not present

## 2018-04-18 DIAGNOSIS — I10 Essential (primary) hypertension: Secondary | ICD-10-CM | POA: Diagnosis not present

## 2018-04-18 DIAGNOSIS — R946 Abnormal results of thyroid function studies: Secondary | ICD-10-CM | POA: Diagnosis not present

## 2018-04-18 DIAGNOSIS — Z1321 Encounter for screening for nutritional disorder: Secondary | ICD-10-CM | POA: Diagnosis not present

## 2018-04-18 DIAGNOSIS — E782 Mixed hyperlipidemia: Secondary | ICD-10-CM | POA: Diagnosis not present

## 2018-07-23 DIAGNOSIS — E782 Mixed hyperlipidemia: Secondary | ICD-10-CM | POA: Diagnosis not present

## 2018-07-23 DIAGNOSIS — C20 Malignant neoplasm of rectum: Secondary | ICD-10-CM | POA: Diagnosis not present

## 2018-07-23 DIAGNOSIS — R159 Full incontinence of feces: Secondary | ICD-10-CM | POA: Diagnosis not present

## 2018-07-23 DIAGNOSIS — I10 Essential (primary) hypertension: Secondary | ICD-10-CM | POA: Diagnosis not present

## 2018-07-23 DIAGNOSIS — Z9119 Patient's noncompliance with other medical treatment and regimen: Secondary | ICD-10-CM | POA: Diagnosis not present

## 2018-07-23 DIAGNOSIS — Z0000 Encounter for general adult medical examination without abnormal findings: Secondary | ICD-10-CM | POA: Diagnosis not present

## 2018-07-23 DIAGNOSIS — F329 Major depressive disorder, single episode, unspecified: Secondary | ICD-10-CM | POA: Diagnosis not present

## 2018-07-23 DIAGNOSIS — Z85038 Personal history of other malignant neoplasm of large intestine: Secondary | ICD-10-CM | POA: Diagnosis not present

## 2018-07-23 DIAGNOSIS — Z1231 Encounter for screening mammogram for malignant neoplasm of breast: Secondary | ICD-10-CM | POA: Diagnosis not present

## 2018-07-23 DIAGNOSIS — E559 Vitamin D deficiency, unspecified: Secondary | ICD-10-CM | POA: Diagnosis not present

## 2018-08-09 ENCOUNTER — Telehealth: Payer: Self-pay | Admitting: Oncology

## 2018-08-09 NOTE — Telephone Encounter (Signed)
Opened by accident

## 2018-08-15 ENCOUNTER — Telehealth: Payer: Self-pay | Admitting: Nurse Practitioner

## 2018-08-15 NOTE — Telephone Encounter (Signed)
Returned patient's phone call regarding rescheduling an appointment, patient rescheduled lab and follow up appointment from 10/06 to 06/11.

## 2018-08-21 ENCOUNTER — Other Ambulatory Visit: Payer: Self-pay | Admitting: *Deleted

## 2018-08-21 DIAGNOSIS — C20 Malignant neoplasm of rectum: Secondary | ICD-10-CM

## 2018-08-22 ENCOUNTER — Other Ambulatory Visit: Payer: Self-pay

## 2018-08-22 ENCOUNTER — Encounter: Payer: Self-pay | Admitting: Nurse Practitioner

## 2018-08-22 ENCOUNTER — Inpatient Hospital Stay: Payer: PPO | Attending: Oncology

## 2018-08-22 ENCOUNTER — Inpatient Hospital Stay (HOSPITAL_BASED_OUTPATIENT_CLINIC_OR_DEPARTMENT_OTHER): Payer: PPO | Admitting: Nurse Practitioner

## 2018-08-22 ENCOUNTER — Telehealth: Payer: Self-pay | Admitting: Nurse Practitioner

## 2018-08-22 VITALS — BP 144/86 | HR 57 | Temp 98.9°F | Resp 17 | Ht 62.0 in | Wt 143.2 lb

## 2018-08-22 DIAGNOSIS — R159 Full incontinence of feces: Secondary | ICD-10-CM

## 2018-08-22 DIAGNOSIS — Z85038 Personal history of other malignant neoplasm of large intestine: Secondary | ICD-10-CM

## 2018-08-22 DIAGNOSIS — C20 Malignant neoplasm of rectum: Secondary | ICD-10-CM

## 2018-08-22 LAB — CEA (IN HOUSE-CHCC): CEA (CHCC-In House): 4.3 ng/mL (ref 0.00–5.00)

## 2018-08-22 NOTE — Telephone Encounter (Signed)
Scheduled appt per 6/11 los. A calendar will be mailed out. °

## 2018-08-22 NOTE — Progress Notes (Signed)
  [place] OFFICE PROGRESS NOTE   Diagnosis: Rectal cancer  INTERVAL HISTORY:   Heather Rasmussen was last seen at the [facility] 12/18/2016.  She recently contacted the office to schedule an appointment.  Overall she is feeling well.  Bowels are intermittently erratic.  She relates this to her diet.  No bleeding.  She denies abdominal pain she has a good appetite.  No fever, cough, shortness of breath.  Objective:  Vital signs in last 24 hours:  Blood pressure (!) 144/86, pulse (!) 57, temperature 98.9 F (37.2 C), temperature source Oral, resp. rate 17, height 5\' 2"  (1.575 m), weight 143 lb 3.2 oz (65 kg), SpO2 100 %.    HEENT: Neck without mass. Lymphatics: No palpable cervical, supraclavicular, axillary or inguinal lymph nodes. Resp: Lungs clear bilaterally. Cardio: Regular rate and rhythm. GI: Abdomen soft and nontender.  No hepatomegaly. Vascular: No leg edema.    Lab Results:  Lab Results  Component Value Date   WBC 4.7 10/24/2012   HGB 14.2 10/24/2012   HCT 41.7 10/24/2012   MCV 100.3 10/24/2012   PLT 176 10/24/2012   NEUTROABS 3.3 10/24/2012    Imaging:  No results found.  Medications: I have reviewed the patient's current medications.  Assessment/Plan: 1. Rectal cancer-stage IV, status post infusional 5-FU/radiation beginning 02/12/2006, status post a low anterior resection with loop ileostomy and liver biopsy on 05/02/2006.  2. Liver resection at [facility] 06/21/2006.  3. Loop ileostomy closure November 2008.  4. 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  5. Fecal incontinence, chronic since surgery 6. Screening colonoscopy 01/07/2014. Diminutive polyp found in the rectum. Some bleeding at the anastomosis. Diminutive polyp in the proximal ascending colon. Pathology on rectal polyp showed mucosal prolapse-type polyp. No dysplasia or malignancy identified. Next colonoscopy at a five-year interval.    Disposition: Heather Rasmussen remains in clinical remission from rectal cancer.  CEA remains in normal range.  She is due for colonoscopy later this year.  She will contact Dr. Lorie Apley office to schedule.  She will return for a CEA and follow-up visit here in 1 year.  She will contact the office in the interim with any problems.  Plan reviewed with Dr. Benay Spice.    Ned Card ANP/GNP-BC   08/22/2018  12:46 PM

## 2018-09-02 DIAGNOSIS — Z1231 Encounter for screening mammogram for malignant neoplasm of breast: Secondary | ICD-10-CM | POA: Diagnosis not present

## 2018-09-10 DIAGNOSIS — N6311 Unspecified lump in the right breast, upper outer quadrant: Secondary | ICD-10-CM | POA: Diagnosis not present

## 2018-09-10 DIAGNOSIS — R928 Other abnormal and inconclusive findings on diagnostic imaging of breast: Secondary | ICD-10-CM | POA: Diagnosis not present

## 2018-09-18 DIAGNOSIS — E559 Vitamin D deficiency, unspecified: Secondary | ICD-10-CM | POA: Diagnosis not present

## 2018-09-18 DIAGNOSIS — E782 Mixed hyperlipidemia: Secondary | ICD-10-CM | POA: Diagnosis not present

## 2018-09-18 DIAGNOSIS — I10 Essential (primary) hypertension: Secondary | ICD-10-CM | POA: Diagnosis not present

## 2018-09-19 DIAGNOSIS — D0511 Intraductal carcinoma in situ of right breast: Secondary | ICD-10-CM | POA: Diagnosis not present

## 2018-09-19 DIAGNOSIS — Z170 Estrogen receptor positive status [ER+]: Secondary | ICD-10-CM | POA: Diagnosis not present

## 2018-09-19 DIAGNOSIS — R928 Other abnormal and inconclusive findings on diagnostic imaging of breast: Secondary | ICD-10-CM | POA: Diagnosis not present

## 2018-09-19 DIAGNOSIS — N6311 Unspecified lump in the right breast, upper outer quadrant: Secondary | ICD-10-CM | POA: Diagnosis not present

## 2018-10-03 DIAGNOSIS — F329 Major depressive disorder, single episode, unspecified: Secondary | ICD-10-CM | POA: Diagnosis not present

## 2018-10-03 DIAGNOSIS — D7589 Other specified diseases of blood and blood-forming organs: Secondary | ICD-10-CM | POA: Diagnosis not present

## 2018-10-03 DIAGNOSIS — R946 Abnormal results of thyroid function studies: Secondary | ICD-10-CM | POA: Diagnosis not present

## 2018-10-03 DIAGNOSIS — D0511 Intraductal carcinoma in situ of right breast: Secondary | ICD-10-CM | POA: Diagnosis not present

## 2018-10-03 DIAGNOSIS — E782 Mixed hyperlipidemia: Secondary | ICD-10-CM | POA: Diagnosis not present

## 2018-10-03 DIAGNOSIS — I10 Essential (primary) hypertension: Secondary | ICD-10-CM | POA: Diagnosis not present

## 2018-10-03 DIAGNOSIS — Z9119 Patient's noncompliance with other medical treatment and regimen: Secondary | ICD-10-CM | POA: Diagnosis not present

## 2018-10-03 DIAGNOSIS — C20 Malignant neoplasm of rectum: Secondary | ICD-10-CM | POA: Diagnosis not present

## 2018-10-03 DIAGNOSIS — C787 Secondary malignant neoplasm of liver and intrahepatic bile duct: Secondary | ICD-10-CM | POA: Diagnosis not present

## 2018-10-03 DIAGNOSIS — E559 Vitamin D deficiency, unspecified: Secondary | ICD-10-CM | POA: Diagnosis not present

## 2018-10-11 DIAGNOSIS — I10 Essential (primary) hypertension: Secondary | ICD-10-CM | POA: Diagnosis not present

## 2018-10-11 DIAGNOSIS — D0511 Intraductal carcinoma in situ of right breast: Secondary | ICD-10-CM | POA: Diagnosis not present

## 2018-10-16 DIAGNOSIS — Z1159 Encounter for screening for other viral diseases: Secondary | ICD-10-CM | POA: Diagnosis not present

## 2018-10-17 DIAGNOSIS — Z9221 Personal history of antineoplastic chemotherapy: Secondary | ICD-10-CM | POA: Diagnosis not present

## 2018-10-17 DIAGNOSIS — C20 Malignant neoplasm of rectum: Secondary | ICD-10-CM | POA: Diagnosis not present

## 2018-10-17 DIAGNOSIS — D0511 Intraductal carcinoma in situ of right breast: Secondary | ICD-10-CM | POA: Diagnosis not present

## 2018-10-17 DIAGNOSIS — Z85048 Personal history of other malignant neoplasm of rectum, rectosigmoid junction, and anus: Secondary | ICD-10-CM | POA: Diagnosis not present

## 2018-10-17 DIAGNOSIS — Z923 Personal history of irradiation: Secondary | ICD-10-CM | POA: Diagnosis not present

## 2018-10-17 DIAGNOSIS — Z87891 Personal history of nicotine dependence: Secondary | ICD-10-CM | POA: Diagnosis not present

## 2018-10-22 DIAGNOSIS — C50411 Malignant neoplasm of upper-outer quadrant of right female breast: Secondary | ICD-10-CM | POA: Diagnosis not present

## 2018-10-22 DIAGNOSIS — I10 Essential (primary) hypertension: Secondary | ICD-10-CM | POA: Diagnosis not present

## 2018-10-22 DIAGNOSIS — C50911 Malignant neoplasm of unspecified site of right female breast: Secondary | ICD-10-CM | POA: Diagnosis not present

## 2018-10-22 DIAGNOSIS — D0511 Intraductal carcinoma in situ of right breast: Secondary | ICD-10-CM | POA: Diagnosis not present

## 2018-10-22 DIAGNOSIS — Z87891 Personal history of nicotine dependence: Secondary | ICD-10-CM | POA: Diagnosis not present

## 2018-10-24 ENCOUNTER — Telehealth: Payer: Self-pay | Admitting: Oncology

## 2018-10-24 ENCOUNTER — Encounter: Payer: Self-pay | Admitting: *Deleted

## 2018-10-24 NOTE — Progress Notes (Signed)
S/P lumpectomy for DCIS. Per Dr. Benay Spice: Schedule with him or NP in next 2 weeks. Scheduling message sent.

## 2018-10-24 NOTE — Telephone Encounter (Signed)
Scheduled appt per 8/13 sch message- unable to reach pt . Mailed letter with appt date and time

## 2018-10-31 ENCOUNTER — Telehealth: Payer: Self-pay | Admitting: Nurse Practitioner

## 2018-10-31 NOTE — Telephone Encounter (Signed)
Received a message from New patient scheduler , pt called in asking about appt . Called home and it doesn't seem to be the right number .  Called mobile and was able to reach pt . Pt is aware of appt on 8/27

## 2018-11-07 ENCOUNTER — Encounter: Payer: Self-pay | Admitting: Nurse Practitioner

## 2018-11-07 ENCOUNTER — Telehealth: Payer: Self-pay | Admitting: Oncology

## 2018-11-07 ENCOUNTER — Other Ambulatory Visit: Payer: Self-pay

## 2018-11-07 ENCOUNTER — Inpatient Hospital Stay: Payer: PPO | Attending: Oncology | Admitting: Nurse Practitioner

## 2018-11-07 VITALS — BP 112/65 | HR 63 | Temp 99.2°F | Resp 18 | Ht 62.0 in | Wt 143.4 lb

## 2018-11-07 DIAGNOSIS — Z8041 Family history of malignant neoplasm of ovary: Secondary | ICD-10-CM | POA: Insufficient documentation

## 2018-11-07 DIAGNOSIS — C20 Malignant neoplasm of rectum: Secondary | ICD-10-CM

## 2018-11-07 DIAGNOSIS — Z85038 Personal history of other malignant neoplasm of large intestine: Secondary | ICD-10-CM | POA: Diagnosis not present

## 2018-11-07 DIAGNOSIS — D0511 Intraductal carcinoma in situ of right breast: Secondary | ICD-10-CM | POA: Insufficient documentation

## 2018-11-07 DIAGNOSIS — Z800 Family history of malignant neoplasm of digestive organs: Secondary | ICD-10-CM | POA: Diagnosis not present

## 2018-11-07 DIAGNOSIS — Z803 Family history of malignant neoplasm of breast: Secondary | ICD-10-CM | POA: Insufficient documentation

## 2018-11-07 MED ORDER — ANASTROZOLE 1 MG PO TABS: 1.0000 mg | ORAL_TABLET | Freq: Every day | ORAL | 3 refills | Status: DC

## 2018-11-07 NOTE — Progress Notes (Addendum)
[facility] OFFICE PROGRESS NOTE   Diagnosis: DCIS, rectal cancer  INTERVAL HISTORY:   Heather Rasmussen returns prior to scheduled follow-up.  To review, she is a 68 year old woman with a history of stage IV rectal cancer dating to 2007.  She completed infusional 5-FU and radiation followed by low anterior resection with loop ileostomy and liver biopsy on 05/02/2006.  She underwent liver resection at [facility] on 06/21/2006.  At the time of her last visit here on 08/22/2018 she was felt to be in clinical remission from rectal cancer and CEA was in normal range.  She was scheduled to return for a CEA and follow-up visit in 1 year.    She underwent a screening mammogram on 09/02/2018 with finding of possible asymmetry within the outer right breast.  Diagnostic mammogram on 09/10/2018 showed an indeterminate 9 mm mass over the 10:30 position of the right breast 5 cm from the nipple.  No abnormal right axillary lymph nodes.  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  No invasive tumor identified.  She underwent right lateral breast excisional biopsy 10/22/2018 with pathology showing ductal carcinoma in situ, high nuclear grade with central necrosis and associated microcalcifications.  Tumor was less than 1 mm from the anterior margin.  There were changes consistent with previous biopsy site.  Excisional biopsy additional lateral anterior showed no evidence of malignancy.  ER positive, greater than 95%, PR +90%.  Family history positive for maternal aunt and maternal cousin with breast cancer both in their 13s, maternal great grandmother with stomach cancer.  Also above maternal cousins daughter with ovarian cancer.  Heather Rasmussen lives in [place].  She has a daughter.  No tobacco use.  Occasional alcohol intake.  Right breast soreness is improving.  She reports a good appetite.  No fever.  She has occasional sweats at nighttime.   No unusual headaches.  No visual disturbance.  No shortness of breath or cough.  No change in bowel habits.  No abdominal pain.  She denies nausea/vomiting.  No dysphagia.  She denies chest pain.  No dysuria.  She denies vaginal bleeding.  Last menstrual cycle was at age 12.  Objective:  Vital signs in last 24 hours:  Blood pressure 112/65, pulse 63, temperature 99.2 F (37.3 C), temperature source Temporal, resp. rate 18, height 5\' 2"  (1.575 m), weight 143 lb 6.4 oz (65 kg), SpO2 100 %.    HEENT: Pupils equal round and reactive to light.  Sclera anicteric.  Neck without mass. Lymphatics: No palpable cervical, supraclavicular, axillary or inguinal lymph nodes. Resp: Lungs clear bilaterally. Cardio: Regular rate and rhythm. GI: Abdomen soft and nontender.  No hepatomegaly. Vascular: No leg edema. Neuro: Alert and oriented. Skin: No rash. Breast: No mass palpated in the left breast.  Healing surgical incision right breast, upper outer quadrant; resolving ecchymosis; right breast is edematous.   Lab Results:  Lab Results  Component Value Date   WBC 4.7 10/24/2012   HGB 14.2 10/24/2012   HCT 41.7 10/24/2012   MCV 100.3 10/24/2012   PLT 176 10/24/2012   NEUTROABS 3.3 10/24/2012    Imaging:  No results found.  Medications: I have reviewed the patient's current medications.  Assessment/Plan: 1. Rectal cancer-stage IV, status post infusional 5-FU/radiation beginning 02/12/2006, status post a low anterior resection with loop ileostomy and liver biopsy on 05/02/2006.  2. Liver resection at [facility] 06/21/2006.  3. Loop ileostomy closure November  2008.  4. 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  5. Fecal incontinence, chronic since surgery 6. Screening colonoscopy 01/07/2014. Diminutive polyp found in the rectum. Some bleeding at the anastomosis. Diminutive polyp in the proximal ascending colon. Pathology on rectal polyp showed mucosal  prolapse-type polyp. No dysplasia or malignancy identified. Next colonoscopy at a five-year interval. 7.  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  No abnormal right axillary lymph nodes.  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  No invasive tumor identified.  Right lateral breast excisional biopsy 10/22/2018- ductal carcinoma in situ, high nuclear grade with central necrosis and associated microcalcifications.  Tumor less than 1 mm from the anterior margin.  Changes consistent with previous biopsy site.  Excisional biopsy additional lateral anterior-no evidence of malignancy.  ER positive (greater than 95%), PR +90%   Disposition: Heather Rasmussen is a 68 year old woman with a history of stage IV rectal cancer recently diagnosed with DCIS right breast, high-grade, ER/PR positive.  She underwent a lumpectomy on 10/22/2018.  She has seen Dr. Pablo Ledger, radiation oncology, with the plan for simulation 11/11/2018.  Dr. Benay Spice reviewed the DCIS diagnosis, prognosis and treatment options with Heather Rasmussen at today's visit.  She understands the recommendation for hormonal therapy with Arimidex following completion of radiation.  We reviewed potential toxicities associated with Arimidex including hot flashes, arthralgias, decreased bone density, elevated cholesterol, hair loss.  She agrees to proceed.  We are referring her for a baseline bone density study.  She will begin Arimidex approximately 3 days after she completes the course of radiation.  We discussed a referral to the genetics counselor due to her family history which is significant for multiple individuals with cancer including breast, ovarian and stomach.  She agrees to proceed with the referral.  She will return for a  follow-up visit in 2 to 3 months.  She will contact the office in the interim with any problems.  Patient seen with Dr. Benay Spice.  40 minutes were spent face-to-face at today's visit with the majority of that time involved in counseling/coordination of care.    Ned Card ANP/GNP-BC   11/07/2018  12:04 PM  Heather Rasmussen has been diagnosed with right breast DCIS.  She is scheduled to undergo adjuvant radiation with Dr. Pablo Ledger.  We reviewed the prognosis and adjuvant systemic treatment options with Heather Rasmussen.  I recommend adjuvant Arimidex.  We reviewed potential toxicities associated with Arimidex.  She agrees to proceed.  She will be referred to the genetics counselor.  Julieanne Manson, MD

## 2018-11-07 NOTE — Telephone Encounter (Signed)
Scheduled per 08/27 los, patient received avs and calender. °

## 2018-11-08 ENCOUNTER — Telehealth: Payer: Self-pay | Admitting: Oncology

## 2018-11-08 NOTE — Telephone Encounter (Signed)
Called patient regarding upcoming Genetics appointments, patient is notified.

## 2018-11-11 DIAGNOSIS — Z85048 Personal history of other malignant neoplasm of rectum, rectosigmoid junction, and anus: Secondary | ICD-10-CM | POA: Diagnosis not present

## 2018-11-11 DIAGNOSIS — Z510 Encounter for antineoplastic radiation therapy: Secondary | ICD-10-CM | POA: Diagnosis not present

## 2018-11-11 DIAGNOSIS — D0511 Intraductal carcinoma in situ of right breast: Secondary | ICD-10-CM | POA: Diagnosis not present

## 2018-11-11 DIAGNOSIS — Z8505 Personal history of malignant neoplasm of liver: Secondary | ICD-10-CM | POA: Diagnosis not present

## 2018-11-20 DIAGNOSIS — Z85048 Personal history of other malignant neoplasm of rectum, rectosigmoid junction, and anus: Secondary | ICD-10-CM | POA: Diagnosis not present

## 2018-11-20 DIAGNOSIS — R202 Paresthesia of skin: Secondary | ICD-10-CM | POA: Diagnosis not present

## 2018-11-20 DIAGNOSIS — N6489 Other specified disorders of breast: Secondary | ICD-10-CM | POA: Diagnosis not present

## 2018-11-20 DIAGNOSIS — L299 Pruritus, unspecified: Secondary | ICD-10-CM | POA: Diagnosis not present

## 2018-11-20 DIAGNOSIS — N951 Menopausal and female climacteric states: Secondary | ICD-10-CM | POA: Diagnosis not present

## 2018-11-20 DIAGNOSIS — Z510 Encounter for antineoplastic radiation therapy: Secondary | ICD-10-CM | POA: Diagnosis not present

## 2018-11-20 DIAGNOSIS — G4700 Insomnia, unspecified: Secondary | ICD-10-CM | POA: Diagnosis not present

## 2018-11-20 DIAGNOSIS — Z8505 Personal history of malignant neoplasm of liver: Secondary | ICD-10-CM | POA: Diagnosis not present

## 2018-11-20 DIAGNOSIS — D0511 Intraductal carcinoma in situ of right breast: Secondary | ICD-10-CM | POA: Diagnosis not present

## 2018-11-22 DIAGNOSIS — D0511 Intraductal carcinoma in situ of right breast: Secondary | ICD-10-CM | POA: Diagnosis not present

## 2018-11-25 DIAGNOSIS — Z510 Encounter for antineoplastic radiation therapy: Secondary | ICD-10-CM | POA: Diagnosis not present

## 2018-11-25 DIAGNOSIS — D0511 Intraductal carcinoma in situ of right breast: Secondary | ICD-10-CM | POA: Diagnosis not present

## 2018-11-26 DIAGNOSIS — D0511 Intraductal carcinoma in situ of right breast: Secondary | ICD-10-CM | POA: Diagnosis not present

## 2018-11-26 DIAGNOSIS — Z510 Encounter for antineoplastic radiation therapy: Secondary | ICD-10-CM | POA: Diagnosis not present

## 2018-11-27 DIAGNOSIS — D0511 Intraductal carcinoma in situ of right breast: Secondary | ICD-10-CM | POA: Diagnosis not present

## 2018-11-27 DIAGNOSIS — Z510 Encounter for antineoplastic radiation therapy: Secondary | ICD-10-CM | POA: Diagnosis not present

## 2018-11-28 ENCOUNTER — Encounter: Payer: Self-pay | Admitting: Genetic Counselor

## 2018-11-28 DIAGNOSIS — Z510 Encounter for antineoplastic radiation therapy: Secondary | ICD-10-CM | POA: Diagnosis not present

## 2018-11-28 DIAGNOSIS — D0511 Intraductal carcinoma in situ of right breast: Secondary | ICD-10-CM | POA: Diagnosis not present

## 2018-11-29 DIAGNOSIS — D0511 Intraductal carcinoma in situ of right breast: Secondary | ICD-10-CM | POA: Diagnosis not present

## 2018-11-29 DIAGNOSIS — Z510 Encounter for antineoplastic radiation therapy: Secondary | ICD-10-CM | POA: Diagnosis not present

## 2018-12-02 DIAGNOSIS — Z510 Encounter for antineoplastic radiation therapy: Secondary | ICD-10-CM | POA: Diagnosis not present

## 2018-12-02 DIAGNOSIS — D0511 Intraductal carcinoma in situ of right breast: Secondary | ICD-10-CM | POA: Diagnosis not present

## 2018-12-03 DIAGNOSIS — Z510 Encounter for antineoplastic radiation therapy: Secondary | ICD-10-CM | POA: Diagnosis not present

## 2018-12-03 DIAGNOSIS — D0511 Intraductal carcinoma in situ of right breast: Secondary | ICD-10-CM | POA: Diagnosis not present

## 2018-12-04 DIAGNOSIS — D0511 Intraductal carcinoma in situ of right breast: Secondary | ICD-10-CM | POA: Diagnosis not present

## 2018-12-04 DIAGNOSIS — Z510 Encounter for antineoplastic radiation therapy: Secondary | ICD-10-CM | POA: Diagnosis not present

## 2018-12-05 DIAGNOSIS — Z510 Encounter for antineoplastic radiation therapy: Secondary | ICD-10-CM | POA: Diagnosis not present

## 2018-12-05 DIAGNOSIS — D0511 Intraductal carcinoma in situ of right breast: Secondary | ICD-10-CM | POA: Diagnosis not present

## 2018-12-06 DIAGNOSIS — Z510 Encounter for antineoplastic radiation therapy: Secondary | ICD-10-CM | POA: Diagnosis not present

## 2018-12-06 DIAGNOSIS — D0511 Intraductal carcinoma in situ of right breast: Secondary | ICD-10-CM | POA: Diagnosis not present

## 2018-12-09 DIAGNOSIS — D0511 Intraductal carcinoma in situ of right breast: Secondary | ICD-10-CM | POA: Diagnosis not present

## 2018-12-09 DIAGNOSIS — Z510 Encounter for antineoplastic radiation therapy: Secondary | ICD-10-CM | POA: Diagnosis not present

## 2018-12-10 DIAGNOSIS — Z510 Encounter for antineoplastic radiation therapy: Secondary | ICD-10-CM | POA: Diagnosis not present

## 2018-12-10 DIAGNOSIS — D0511 Intraductal carcinoma in situ of right breast: Secondary | ICD-10-CM | POA: Diagnosis not present

## 2018-12-11 DIAGNOSIS — D0511 Intraductal carcinoma in situ of right breast: Secondary | ICD-10-CM | POA: Diagnosis not present

## 2018-12-11 DIAGNOSIS — Z510 Encounter for antineoplastic radiation therapy: Secondary | ICD-10-CM | POA: Diagnosis not present

## 2018-12-12 DIAGNOSIS — Z8505 Personal history of malignant neoplasm of liver: Secondary | ICD-10-CM | POA: Diagnosis not present

## 2018-12-12 DIAGNOSIS — D0511 Intraductal carcinoma in situ of right breast: Secondary | ICD-10-CM | POA: Diagnosis not present

## 2018-12-12 DIAGNOSIS — Z85048 Personal history of other malignant neoplasm of rectum, rectosigmoid junction, and anus: Secondary | ICD-10-CM | POA: Diagnosis not present

## 2018-12-12 DIAGNOSIS — Z510 Encounter for antineoplastic radiation therapy: Secondary | ICD-10-CM | POA: Diagnosis not present

## 2018-12-13 DIAGNOSIS — D0511 Intraductal carcinoma in situ of right breast: Secondary | ICD-10-CM | POA: Diagnosis not present

## 2018-12-13 DIAGNOSIS — Z510 Encounter for antineoplastic radiation therapy: Secondary | ICD-10-CM | POA: Diagnosis not present

## 2018-12-16 DIAGNOSIS — D0511 Intraductal carcinoma in situ of right breast: Secondary | ICD-10-CM | POA: Diagnosis not present

## 2018-12-16 DIAGNOSIS — Z510 Encounter for antineoplastic radiation therapy: Secondary | ICD-10-CM | POA: Diagnosis not present

## 2018-12-17 DIAGNOSIS — Z510 Encounter for antineoplastic radiation therapy: Secondary | ICD-10-CM | POA: Diagnosis not present

## 2018-12-17 DIAGNOSIS — D0511 Intraductal carcinoma in situ of right breast: Secondary | ICD-10-CM | POA: Diagnosis not present

## 2019-01-08 DIAGNOSIS — I10 Essential (primary) hypertension: Secondary | ICD-10-CM | POA: Diagnosis not present

## 2019-01-08 DIAGNOSIS — R946 Abnormal results of thyroid function studies: Secondary | ICD-10-CM | POA: Diagnosis not present

## 2019-01-08 DIAGNOSIS — E782 Mixed hyperlipidemia: Secondary | ICD-10-CM | POA: Diagnosis not present

## 2019-01-15 ENCOUNTER — Telehealth: Payer: Self-pay | Admitting: Licensed Clinical Social Worker

## 2019-01-15 NOTE — Telephone Encounter (Signed)
Called patient regarding upcoming [facility] appointment, per patient's request this will be a walk-in visit. °

## 2019-01-16 ENCOUNTER — Encounter: Payer: Self-pay | Admitting: Licensed Clinical Social Worker

## 2019-01-16 ENCOUNTER — Inpatient Hospital Stay: Payer: PPO

## 2019-01-16 ENCOUNTER — Other Ambulatory Visit: Payer: Self-pay

## 2019-01-16 ENCOUNTER — Inpatient Hospital Stay (HOSPITAL_BASED_OUTPATIENT_CLINIC_OR_DEPARTMENT_OTHER): Payer: PPO | Admitting: Licensed Clinical Social Worker

## 2019-01-16 ENCOUNTER — Inpatient Hospital Stay: Payer: PPO | Attending: Oncology | Admitting: Oncology

## 2019-01-16 DIAGNOSIS — Z1379 Encounter for other screening for genetic and chromosomal anomalies: Secondary | ICD-10-CM | POA: Insufficient documentation

## 2019-01-16 DIAGNOSIS — I10 Essential (primary) hypertension: Secondary | ICD-10-CM | POA: Diagnosis not present

## 2019-01-16 DIAGNOSIS — C189 Malignant neoplasm of colon, unspecified: Secondary | ICD-10-CM

## 2019-01-16 DIAGNOSIS — F329 Major depressive disorder, single episode, unspecified: Secondary | ICD-10-CM | POA: Diagnosis not present

## 2019-01-16 DIAGNOSIS — Z85038 Personal history of other malignant neoplasm of large intestine: Secondary | ICD-10-CM | POA: Insufficient documentation

## 2019-01-16 DIAGNOSIS — Z315 Encounter for genetic counseling: Secondary | ICD-10-CM | POA: Diagnosis not present

## 2019-01-16 DIAGNOSIS — Z923 Personal history of irradiation: Secondary | ICD-10-CM | POA: Diagnosis not present

## 2019-01-16 DIAGNOSIS — D0511 Intraductal carcinoma in situ of right breast: Secondary | ICD-10-CM | POA: Diagnosis not present

## 2019-01-16 DIAGNOSIS — Z803 Family history of malignant neoplasm of breast: Secondary | ICD-10-CM | POA: Diagnosis not present

## 2019-01-16 DIAGNOSIS — E559 Vitamin D deficiency, unspecified: Secondary | ICD-10-CM | POA: Diagnosis not present

## 2019-01-16 DIAGNOSIS — C20 Malignant neoplasm of rectum: Secondary | ICD-10-CM | POA: Diagnosis not present

## 2019-01-16 DIAGNOSIS — D7589 Other specified diseases of blood and blood-forming organs: Secondary | ICD-10-CM | POA: Diagnosis not present

## 2019-01-16 DIAGNOSIS — Z9119 Patient's noncompliance with other medical treatment and regimen: Secondary | ICD-10-CM | POA: Diagnosis not present

## 2019-01-16 DIAGNOSIS — Z800 Family history of malignant neoplasm of digestive organs: Secondary | ICD-10-CM | POA: Diagnosis not present

## 2019-01-16 DIAGNOSIS — C787 Secondary malignant neoplasm of liver and intrahepatic bile duct: Secondary | ICD-10-CM | POA: Diagnosis not present

## 2019-01-16 DIAGNOSIS — R946 Abnormal results of thyroid function studies: Secondary | ICD-10-CM | POA: Diagnosis not present

## 2019-01-16 DIAGNOSIS — E782 Mixed hyperlipidemia: Secondary | ICD-10-CM | POA: Diagnosis not present

## 2019-01-16 MED ORDER — ANASTROZOLE 1 MG PO TABS: 1.0000 mg | ORAL_TABLET | Freq: Every day | ORAL | 5 refills | Status: DC

## 2019-01-16 NOTE — Progress Notes (Signed)
REFERRING PROVIDER: Ladell Pier, MD [address] [place],  [place] [address]  PRIMARY PROVIDER:  Beckie Salts, MD  PRIMARY REASON FOR VISIT:  1. 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 Family history of breast cancer   3. Family history of stomach cancer   4. Malignant neoplasm of colon, unspecified part of colon ([place])     HISTORY OF PRESENT ILLNESS:   [name], a 68 y.o. female, was seen for a [place] cancer genetics consultation at the request of Ned Card NP due to a personal and family history of cancer.  Heather Rasmussen presents to clinic today to discuss the possibility of a hereditary predisposition to cancer, genetic testing, and to further clarify her future cancer risks, as well as potential cancer risks for family members.   At the age of 35, Heather Rasmussen was diagnosed with rectal cancer. This was treated with surgery and chemotherapy. At the age of 84, Heather Rasmussen was diagnosed with DCIS of the right breast. This was treated with lumpectomy and radiation. She had genetic testing ordered by her surgeon in 10/2018, results are below.     CANCER HISTORY:  Oncology History   No history exists.     RISK FACTORS:  Menarche was at age 35.  First live birth at age 12.  OCP use for approximately 5 years.  Ovaries intact: 1 ovary.  Hysterectomy: no.  Menopausal status: postmenopausal.  HRT use: 0 years. Colonoscopy: yes; normal. Mammogram within the last year: yes. Number of breast biopsies: 1.   Past Medical History:  Diagnosis Date   Anal condyloma    Cancer ([place]) 2005   rectal   Family history of breast cancer    Family history of stomach cancer    GERD (gastroesophageal reflux disease)    Hemorrhoids    Night sweats    Rectal bleeding     Past Surgical History:  Procedure Laterality Date   LIVER SURGERY     OSTOMY TAKE DOWN     PORT-A-CATH REMOVAL     RECTAL SURGERY     due to cancer     Social History   Socioeconomic History   Marital status: Single    Spouse name: Not on file   Number of children: Not on file   Years of education: Not on file   Highest education level: Not on file  Occupational History   Not on file  Social Needs   Financial resource strain: Not on file   Food insecurity    Worry: Not on file    Inability: Not on file   Transportation needs    Medical: Not on file    Non-medical: Not on file  Tobacco Use   Smoking status: Former Smoker    Packs/day: 0.50    Years: 25.00    Pack years: 12.50    Types: Cigarettes   Smokeless tobacco: Never Used  Substance and Sexual Activity   Alcohol use: Yes    Alcohol/week: 1.0 - 2.0 standard drinks    Types: 1 - 2 Glasses of wine per week   Drug use: No   Sexual activity: Not on file  Lifestyle   Physical activity    Days per week: Not on file    Minutes per session: Not on file   Stress: Not on file  Relationships   Social connections    Talks on phone: Not on file    Gets together: Not  on file    Attends religious service: Not on file    Active member of club or organization: Not on file    Attends meetings of clubs or organizations: Not on file    Relationship status: Not on file  Other Topics Concern   Not on file  Social History Narrative   Not on file     FAMILY HISTORY:  We obtained a detailed, 4-generation family history.  Significant diagnoses are listed below: Family History  Problem Relation Age of Onset   Hypertension Mother    Hypertension Father    Breast cancer Maternal Aunt        dx 33s   Stomach cancer Maternal Great-grandmother    Breast cancer Cousin 21   Heather Rasmussen has one daughter, age 69. Patient does not have full siblings. Her father did have other children in his first marriage.  Heather Rasmussen mother died at 43. She did not have any cancers. Patient had 1 maternal aunt, she had breast cancer in her 14s and died at 70. Patient  has 1 maternal cousin who also had breast cancer at 38 and is living at 33. Patient is unsure if her cousin had genetic testing. No cancers for maternal grandparents. Her grandmother's mother did have stomach cancer.  Heather Rasmussen's father died at 37, no cancers. Patient had 7 paternal aunts, 6 paternal uncles, but she has limited information about them and her paternal cousins. She believes a few may have had cancer. Both paternal grandparents passed in their 58s.   Heather Rasmussen is unaware of previous family history of genetic testing for hereditary cancer risks.There is no reported Ashkenazi Jewish ancestry. There is no known consanguinity.  GENETIC COUNSELING ASSESSMENT: Heather Rasmussen is a 68 y.o. female with a personal and family history which is somewhat suggestive of a hereditary cancer syndrome and predisposition to cancer. She had normal genetic testing at [facility] in August 2020 through her Psychologist, sport and exercise. We, therefore, discussed and recommended the following at today's visit.   DISCUSSION: We discussed that 5 - 10% of breast cancer is hereditary, with most cases associated with BRCA1/BRCA2 mutations. Colon cancer can be hereditary as well. Based on her personal and family history, she does meet criteria for testing which is why this testing was ordered for her through [facility]. We discussed these results with her and gave her a copy of the report.  GENETIC TEST RESULTS: Genetic testing reported out on 10/18/2018 through the [facility] STAT Breast Cancer Panel + Common Hereditary cancer panel found no pathogenic mutations. The STAT Breast cancer panel offered by [facility] includes sequencing and rearrangement analysis for the following 9 genes:  ATM, BRCA1, BRCA2, CDH1, CHEK2, PALB2, PTEN, STK11 and TP53.  The Common Hereditary Cancers Panel offered by [facility] includes sequencing and/or deletion duplication testing of the following 47 genes: APC, ATM, AXIN2, BARD1, BMPR1A, BRCA1, BRCA2,  BRIP1, CDH1, CDKN2A (p14ARF), CDKN2A (p16INK4a), CKD4, CHEK2, CTNNA1, DICER1, EPCAM (Deletion/duplication testing only), GREM1 (promoter region deletion/duplication testing only), KIT, MEN1, MLH1, MSH2, MSH3, MSH6, MUTYH, NBN, NF1, NHTL1, PALB2, PDGFRA, PMS2, POLD1, POLE, PTEN, RAD50, RAD51C, RAD51D, SDHB, SDHC, SDHD, SMAD4, SMARCA4. STK11, TP53, TSC1, TSC2, and VHL.  The following genes were evaluated for sequence changes only: SDHA and HOXB13 c.251G>A variant only. The test report has been scanned into EPIC and is located under the Molecular Pathology section of the Results Review tab.  A portion of the result report is included below for reference.  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  At this time, it is unknown if this variant is associated with increased cancer risk or if this is a normal finding, but most variants such as this get reclassified to being inconsequential. It should not be used to make medical management decisions. With time, we suspect the lab will determine the significance of this variant, if any. If we do learn more about it, we will try to contact Ms. Villa to discuss it further. However, it is important to stay in touch with [place] periodically and keep the address and phone number up to date.  ADDITIONAL GENETIC TESTING: We discussed with Ms. Desmarais that her genetic testing was fairly extensive.  If there are genes identified to increase cancer risk that can be analyzed in the future, we would be happy to discuss and coordinate this testing at that time.    CANCER SCREENING RECOMMENDATIONS: Ms. Connelly test result is considered negative (normal).  This means that we have not identified a hereditary cause for her  personal and family history of cancer at this time. Most cancers happen by chance and this negative test suggests that her cancer may fall into this category.    While reassuring,  this does not definitively rule out a hereditary predisposition to cancer. It is still possible that there could be genetic mutations that are undetectable by current technology. There could be genetic mutations in genes that have not been tested or identified to increase cancer risk.  Therefore, it is recommended she continue to follow the cancer management and screening guidelines provided by her oncology and primary healthcare provider.   An individual's cancer risk and medical management are not determined by genetic test results alone. Overall cancer risk assessment incorporates additional factors, including personal medical history, family history, and any available genetic information that may result in a personalized plan for cancer prevention and surveillance.  RECOMMENDATIONS FOR FAMILY MEMBERS:  Relatives in this family might be at some increased risk of developing cancer, over the general population risk, simply due to the family history of cancer.  We recommended female relatives in this family have a yearly mammogram beginning at age 55, or 7 years younger than the earliest onset of cancer, an annual clinical breast exam, and perform monthly breast self-exams. Female relatives in this family should also have a gynecological exam as recommended by their primary provider. All family members should have a colonoscopy by age 63, or as directed by their physicians.   It is also possible there is a hereditary cause for the cancer in Ms. Lui's family that she did not inherit and therefore was not identified in her.  Based on Ms. Pearlman's family history, we recommended her maternal cousin have genetic counseling and testing. Ms. Larcom will let us know if we can be of any assistance in coordinating genetic counseling and/or testing for these family members.  FOLLOW-UP: Lastly, we discussed with Ms. Crislip that cancer genetics is a rapidly advancing field and it is possible that new genetic  tests will be appropriate for her and/or her family members in the future. We encouraged her to remain in contact with cancer genetics on an annual basis so we can update her personal and family histories and let her know of advances in cancer genetics that may benefit this family.   Our contact number was provided. Ms. Alderfer questions were answered to her satisfaction, and she  knows she is welcome to call us at anytime with additional questions or concerns.   Faith Rogue, MS, [facility] Genetic Counselor [email or web address] Phone: [phone]  The patient was seen for a total of 45 minutes in face-to-face genetic counseling.  Drs. Magrinat, Lindi Adie and/or Burr Medico were available for discussion regarding this case.   _______________________________________________________________________ For Office Staff:  Number of people involved in session: 1 Was an Intern/ student involved with case: no

## 2019-01-16 NOTE — Progress Notes (Addendum)
  [facility] OFFICE PROGRESS NOTE   Diagnosis: Breast cancer, rectal cancer  INTERVAL HISTORY:   Heather Rasmussen returns as scheduled.  She completed adjuvan right breast radiation on 12/18/2018.  She reports the right breast skin is healing.  She has not started Arimidex.  Objective:  Vital signs in last 24 hours:  Blood pressure 126/77, pulse (!) 58, temperature 98.5 F (36.9 C), temperature source Temporal, resp. rate 18, height 5\' 2"  (1.575 m), weight 147 lb 1.6 oz (66.7 kg), SpO2 100 %.     Lymphatics: No cervical, supraclavicular, axillary, or inguinal nodes GI: No hepatomegaly, nontender Vascular: No leg edema Breast: Left breast without mass.  Firm tissue surrounding the right lumpectomy scar.  Resolving radiation skin breakdown over the right breast, and skin fold inferior inferior to the right breast.   Medications: I have reviewed the patient's current medications.   Assessment/Plan: 1. Rectal cancer-stage IV, status post infusional 5-FU/radiation beginning 02/12/2006, status post a low anterior resection with loop ileostomy and liver biopsy on 05/02/2006.  2. Liver resection at [facility] 06/21/2006.  3. Loop ileostomy closure November 2008.  4. 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  5. Fecal incontinence, chronic since surgery 6. Screening colonoscopy 01/07/2014. Diminutive polyp found in the rectum. Some bleeding at the anastomosis. Diminutive polyp in the proximal ascending colon. Pathology on rectal polyp showed mucosal prolapse-type polyp. No dysplasia or malignancy identified. Next colonoscopy at a five-year interval. 7.  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  No abnormal right axillary lymph nodes.  Biopsy right breast mass  09/19/2018- high-grade ductal carcinoma in situ with comedonecrosis.  No invasive tumor identified.  Right lateral breast excisional biopsy 10/22/2018- ductal carcinoma in situ, high nuclear grade with central necrosis and associated microcalcifications.  Tumor less than 1 mm from the anterior margin.  Changes consistent with previous biopsy site.  Excisional biopsy additional lateral anterior-no evidence of malignancy.  ER positive (greater than 95%), PR +90%  Right breast radiation 11/25/2018 -12/18/2018  Adjuvant Arimidex beginning 01/16/2019  8.  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  She will begin adjuvant Arimidex.  We again reviewed potential toxicities associated with Arimidex including the chance for hot flashes, arthralgias, and decreased bone density.  She will take a calcium and vitamin D supplement while on Arimidex. Heather Rasmussen will return for an office visit in 6 months.  Heather Coder, MD  01/16/2019  11:21 AM

## 2019-01-16 NOTE — Patient Instructions (Signed)
Get started on your Arimidex 1 mg daily--has been sent to your pharmacy Start Calcium 600 mg with Vitamin D 800 units: take one daily (this is over-the-counter)--any pharmacy will have this in vitamin section.

## 2019-01-17 ENCOUNTER — Telehealth: Payer: Self-pay | Admitting: Oncology

## 2019-01-17 NOTE — Telephone Encounter (Signed)
Scheduled per los. Called and spoke with patient. Confirmed appt 

## 2019-01-24 DIAGNOSIS — C50911 Malignant neoplasm of unspecified site of right female breast: Secondary | ICD-10-CM | POA: Diagnosis not present

## 2019-02-05 DIAGNOSIS — S90822A Blister (nonthermal), left foot, initial encounter: Secondary | ICD-10-CM | POA: Diagnosis not present

## 2019-02-05 DIAGNOSIS — X58XXXA Exposure to other specified factors, initial encounter: Secondary | ICD-10-CM | POA: Diagnosis not present

## 2019-02-05 DIAGNOSIS — Y998 Other external cause status: Secondary | ICD-10-CM | POA: Diagnosis not present

## 2019-02-19 ENCOUNTER — Ambulatory Visit
Admission: RE | Admit: 2019-02-19 | Discharge: 2019-02-19 | Disposition: A | Discharge status: Home / Self Care | Payer: PPO | Source: Ambulatory Visit | Attending: Nurse Practitioner | Admitting: Nurse Practitioner

## 2019-02-19 ENCOUNTER — Other Ambulatory Visit: Payer: Self-pay

## 2019-02-19 DIAGNOSIS — Z13820 Encounter for screening for osteoporosis: Secondary | ICD-10-CM | POA: Diagnosis not present

## 2019-02-19 DIAGNOSIS — D0511 Intraductal carcinoma in situ of right breast: Secondary | ICD-10-CM

## 2019-02-19 DIAGNOSIS — Z780 Asymptomatic menopausal state: Secondary | ICD-10-CM | POA: Diagnosis not present

## 2019-04-22 DIAGNOSIS — D0511 Intraductal carcinoma in situ of right breast: Secondary | ICD-10-CM | POA: Diagnosis not present

## 2019-04-22 DIAGNOSIS — C20 Malignant neoplasm of rectum: Secondary | ICD-10-CM | POA: Diagnosis not present

## 2019-04-22 DIAGNOSIS — Z1211 Encounter for screening for malignant neoplasm of colon: Secondary | ICD-10-CM | POA: Diagnosis not present

## 2019-04-22 DIAGNOSIS — D7589 Other specified diseases of blood and blood-forming organs: Secondary | ICD-10-CM | POA: Diagnosis not present

## 2019-04-22 DIAGNOSIS — Z0000 Encounter for general adult medical examination without abnormal findings: Secondary | ICD-10-CM | POA: Diagnosis not present

## 2019-04-22 DIAGNOSIS — E782 Mixed hyperlipidemia: Secondary | ICD-10-CM | POA: Diagnosis not present

## 2019-04-22 DIAGNOSIS — R946 Abnormal results of thyroid function studies: Secondary | ICD-10-CM | POA: Diagnosis not present

## 2019-04-22 DIAGNOSIS — I10 Essential (primary) hypertension: Secondary | ICD-10-CM | POA: Diagnosis not present

## 2019-04-22 DIAGNOSIS — F329 Major depressive disorder, single episode, unspecified: Secondary | ICD-10-CM | POA: Diagnosis not present

## 2019-04-22 DIAGNOSIS — E559 Vitamin D deficiency, unspecified: Secondary | ICD-10-CM | POA: Diagnosis not present

## 2019-04-22 DIAGNOSIS — C787 Secondary malignant neoplasm of liver and intrahepatic bile duct: Secondary | ICD-10-CM | POA: Diagnosis not present

## 2019-04-22 DIAGNOSIS — Z9119 Patient's noncompliance with other medical treatment and regimen: Secondary | ICD-10-CM | POA: Diagnosis not present

## 2019-04-30 DIAGNOSIS — E559 Vitamin D deficiency, unspecified: Secondary | ICD-10-CM | POA: Diagnosis not present

## 2019-04-30 DIAGNOSIS — I10 Essential (primary) hypertension: Secondary | ICD-10-CM | POA: Diagnosis not present

## 2019-04-30 DIAGNOSIS — D7589 Other specified diseases of blood and blood-forming organs: Secondary | ICD-10-CM | POA: Diagnosis not present

## 2019-04-30 DIAGNOSIS — E782 Mixed hyperlipidemia: Secondary | ICD-10-CM | POA: Diagnosis not present

## 2019-06-19 DIAGNOSIS — R928 Other abnormal and inconclusive findings on diagnostic imaging of breast: Secondary | ICD-10-CM | POA: Diagnosis not present

## 2019-06-19 DIAGNOSIS — D0511 Intraductal carcinoma in situ of right breast: Secondary | ICD-10-CM | POA: Diagnosis not present

## 2019-07-04 DIAGNOSIS — D0511 Intraductal carcinoma in situ of right breast: Secondary | ICD-10-CM | POA: Diagnosis not present

## 2019-07-16 ENCOUNTER — Inpatient Hospital Stay: Payer: PPO | Attending: Nurse Practitioner | Admitting: Nurse Practitioner

## 2019-08-22 ENCOUNTER — Inpatient Hospital Stay: Payer: PPO | Attending: Nurse Practitioner

## 2019-08-22 ENCOUNTER — Inpatient Hospital Stay: Payer: PPO | Admitting: Oncology

## 2019-08-25 ENCOUNTER — Telehealth: Payer: Self-pay | Admitting: *Deleted

## 2019-08-25 NOTE — Telephone Encounter (Signed)
Left VM for patient to call to reschedule her missed appointment for lab/OV that was on 08/22/19.

## 2019-08-28 ENCOUNTER — Telehealth: Payer: Self-pay | Admitting: Oncology

## 2019-08-28 NOTE — Telephone Encounter (Signed)
Scheduled appt per 6/16 sch message - no answer. Left message with new appt date and time

## 2019-09-26 ENCOUNTER — Inpatient Hospital Stay: Payer: PPO | Admitting: Oncology

## 2019-09-26 ENCOUNTER — Inpatient Hospital Stay: Payer: PPO | Attending: Nurse Practitioner

## 2019-12-10 DIAGNOSIS — Z23 Encounter for immunization: Secondary | ICD-10-CM | POA: Diagnosis not present

## 2019-12-10 DIAGNOSIS — X58XXXA Exposure to other specified factors, initial encounter: Secondary | ICD-10-CM | POA: Diagnosis not present

## 2019-12-10 DIAGNOSIS — S62356A Nondisplaced fracture of shaft of fifth metacarpal bone, right hand, initial encounter for closed fracture: Secondary | ICD-10-CM | POA: Diagnosis not present

## 2019-12-10 DIAGNOSIS — S60221A Contusion of right hand, initial encounter: Secondary | ICD-10-CM | POA: Diagnosis not present

## 2019-12-15 DIAGNOSIS — S60221A Contusion of right hand, initial encounter: Secondary | ICD-10-CM | POA: Diagnosis not present

## 2020-01-13 DIAGNOSIS — S60221D Contusion of right hand, subsequent encounter: Secondary | ICD-10-CM | POA: Diagnosis not present

## 2020-04-03 ENCOUNTER — Emergency Department (HOSPITAL_BASED_OUTPATIENT_CLINIC_OR_DEPARTMENT_OTHER)
Admission: EM | Admit: 2020-04-03 | Discharge: 2020-04-03 | Disposition: A | Discharge status: Home / Self Care | Payer: Medicare HMO | Attending: Emergency Medicine | Admitting: Emergency Medicine

## 2020-04-03 ENCOUNTER — Other Ambulatory Visit: Payer: Self-pay

## 2020-04-03 ENCOUNTER — Encounter (HOSPITAL_BASED_OUTPATIENT_CLINIC_OR_DEPARTMENT_OTHER): Payer: Self-pay | Admitting: Emergency Medicine

## 2020-04-03 DIAGNOSIS — Z87891 Personal history of nicotine dependence: Secondary | ICD-10-CM | POA: Diagnosis not present

## 2020-04-03 DIAGNOSIS — Z85038 Personal history of other malignant neoplasm of large intestine: Secondary | ICD-10-CM | POA: Insufficient documentation

## 2020-04-03 DIAGNOSIS — K0889 Other specified disorders of teeth and supporting structures: Secondary | ICD-10-CM | POA: Diagnosis not present

## 2020-04-03 DIAGNOSIS — K047 Periapical abscess without sinus: Secondary | ICD-10-CM | POA: Diagnosis not present

## 2020-04-03 DIAGNOSIS — Z79899 Other long term (current) drug therapy: Secondary | ICD-10-CM | POA: Insufficient documentation

## 2020-04-03 DIAGNOSIS — I10 Essential (primary) hypertension: Secondary | ICD-10-CM | POA: Insufficient documentation

## 2020-04-03 HISTORY — DX: Essential (primary) hypertension: I10

## 2020-04-03 MED ORDER — CLINDAMYCIN HCL 150 MG PO CAPS: 300.0000 mg | ORAL_CAPSULE | Freq: Three times a day (TID) | ORAL | 0 refills | Status: AC

## 2020-04-03 MED ORDER — CLINDAMYCIN HCL 150 MG PO CAPS
600.0000 mg | ORAL_CAPSULE | Freq: Once | ORAL | Status: AC
  Administered 2020-04-03: 600 mg via ORAL
  Filled 2020-04-03: qty 4

## 2020-04-03 NOTE — ED Triage Notes (Signed)
Pt arrives pov with driver, ambulatory with c/o right upper oral abscess x 3 days.

## 2020-04-03 NOTE — ED Provider Notes (Signed)
[facility] EMERGENCY DEPARTMENT Provider Note   CSN: [id number] Arrival date & time: 04/03/20  1037     History Chief Complaint  Patient presents with  . Dental Pain    Heather Rasmussen is a 70 y.o. female.  HPI Patient is a 70 year old female with a medical history as noted below.  Presents the emergency department due to dental pain.  Patient reports a history of generally poor dentition.  About 3 days ago she began experiencing upper dental pain as well as a region of swelling.  No fevers, chills, chest pain, shortness of breath, sore throat, difficulty swallowing.  Patient has not attempted to reach out to a dentist as of yet.    Past Medical History:  Diagnosis Date  . Anal condyloma   . Cancer [facility]) 2005   rectal  . Family history of breast cancer   . Family history of stomach cancer   . GERD (gastroesophageal reflux disease)   . Hemorrhoids   . Hypertension   . Night sweats   . Rectal bleeding     Patient Active Problem List   Diagnosis Date Noted  . Genetic testing 01/16/2019  . Family history of breast cancer   . Family history of stomach cancer   . Colon cancer ([place]) 01/17/2012  . Anal ulceration 10/24/2010  . Anal condyloma 10/24/2010    Past Surgical History:  Procedure Laterality Date  . LIVER SURGERY    . OSTOMY TAKE DOWN    . PORT-A-CATH REMOVAL    . RECTAL SURGERY     due to cancer     OB History   No obstetric history on file.     Family History  Problem Relation Age of Onset  . Hypertension Mother   . Hypertension Father   . Breast cancer Maternal Aunt        dx 37s  . Stomach cancer Maternal Great-grandmother   . Breast cancer Cousin 51    Social History   Tobacco Use  . Smoking status: Former Smoker    Packs/day: 0.50    Years: 25.00    Pack years: 12.50    Types: Cigarettes  . Smokeless tobacco: Never Used  Substance Use Topics  . Alcohol use: Yes    Alcohol/week: 1.0 - 2.0 standard drink    Types: 1 - 2  Glasses of wine per week  . Drug use: No    Home Medications Prior to Admission medications   Medication Sig Start Date End Date Taking? Authorizing Provider  clindamycin (CLEOCIN) 150 MG capsule Take 2 capsules (300 mg total) by mouth 3 (three) times daily for 10 days. 04/03/20 04/13/20 Yes Rayna Sexton, PA-C  amLODipine (NORVASC) 5 MG tablet Take 5 mg by mouth daily.     [provider]  anastrozole (ARIMIDEX) 1 MG tablet Take 1 tablet (1 mg total) by mouth daily. 01/16/19   Ladell Pier, MD  Calcium Carb-Cholecalciferol (CALCIUM+D3) 600-800 MG-UNIT TABS Take 1 tablet by mouth daily. 01/16/19   Ladell Pier, MD  metoprolol succinate (TOPROL-XL) 50 MG 24 hr tablet Take 100 mg by mouth.  07/05/16 01/16/19  [provider]    Allergies    Amlodipine, Mometasone, and Morphine  Review of Systems   Review of Systems  Constitutional: Negative for chills and fever.  HENT: Positive for dental problem. Negative for sore throat and trouble swallowing.   Respiratory: Negative for cough and shortness of breath.   Cardiovascular: Negative for chest pain.  Physical Exam Updated Vital Signs BP (!) 161/135 (BP Location: Right Arm)   Pulse 93   Temp 99.3 F (37.4 C) (Oral)   Resp 20   Ht 5\' 2"  (1.575 m)   Wt 66.7 kg   SpO2 97%   BMI 26.89 kg/m   Physical Exam Vitals and nursing note reviewed.  Constitutional:      General: She is not in acute distress.    Appearance: She is well-developed.  HENT:     Head: Normocephalic and atraumatic.     Right Ear: External ear normal.     Left Ear: External ear normal.     Mouth/Throat:     Pharynx: Oropharynx is clear. No oropharyngeal exudate or posterior oropharyngeal erythema.     Comments: Poor dentition with caries and plaque buildup noted diffusely.  Mild tenderness noted with manipulation of the right upper medial incisor.  Small region of induration with no palpable fluctuance noted just posterior to the affected  tooth along the gum.  Uvula midline.  Speaking in clear, complete, and coherent sentences.  Readily handling secretions.  Soft compartments noted in the submental space. Eyes:     General: No scleral icterus.       Right eye: No discharge.        Left eye: No discharge.     Conjunctiva/sclera: Conjunctivae normal.  Neck:     Trachea: No tracheal deviation.     Comments: No cervical lymphadenopathy.  Neck is supple.  Full range of motion of the cervical spine.  No tenderness noted along the C-spine. Cardiovascular:     Rate and Rhythm: Normal rate.  Pulmonary:     Effort: Pulmonary effort is normal. No respiratory distress.     Breath sounds: No stridor.     Comments: No stridor.  No respiratory distress. Abdominal:     General: There is no distension.  Musculoskeletal:        General: No swelling or deformity.     Cervical back: Normal range of motion and neck supple. No rigidity or tenderness.  Lymphadenopathy:     Cervical: No cervical adenopathy.  Skin:    General: Skin is warm and dry.     Findings: No rash.  Neurological:     Mental Status: She is alert.     Cranial Nerves: Cranial nerve deficit: no gross deficits.     ED Results / Procedures / Treatments   Labs (all labs ordered are listed, but only abnormal results are displayed) Labs Reviewed - No data to display  EKG None  Radiology No results found.  Procedures Procedures (including critical care time)  Medications Ordered in ED Medications  clindamycin (CLEOCIN) capsule 600 mg (has no administration in time range)    ED Course  I have reviewed the triage vital signs and the nursing notes.  Pertinent labs & imaging results that were available during my care of the patient were reviewed by me and considered in my medical decision making (see chart for details).    MDM Rules/Calculators/A&P                          Patient is a 70 year old female with poor dentition who presents to the emergency  department due to what appears to be a dental abscess.  She does have a small region of induration just posterior to the right upper medial incisor.  No palpable fluctuance.  Soft submental compartments.  Uvula midline.  No hot potato voice.  Doubt PTA or Ludwig's angina at this time.  No stridor or respiratory distress noted.  Will discharge patient on a 10-day course of clindamycin.  Given a referral to a local dentist.  First dose of clindamycin given in the emergency department.  Discussed return precautions at length.  Her questions were answered and she was amicable at the time of discharge.    Final Clinical Impression(s) / ED Diagnoses Final diagnoses:  Pain, dental  Dental abscess   Rx / DC Orders ED Discharge Orders         Ordered    clindamycin (CLEOCIN) 150 MG capsule  3 times daily        04/03/20 1113           Rayna Sexton, PA-C 04/03/20 1117    Gareth Morgan, MD 04/05/20 1149

## 2020-04-03 NOTE — Discharge Instructions (Addendum)
Like we discussed, I am prescribing you a medication called clindamycin.  This is a strong antibiotic that you need to take 3 times a day for the next 10 days for your infected tooth.  Please do not stop taking this medication early.  I have also given you follow-up information below for a local dentist named Dr. Radford Pax.  Please give him a call and schedule a follow-up appointment.  If your symptoms worsen, please return to the emergency department for reevaluation.  It was a pleasure to meet you.

## 2020-11-02 ENCOUNTER — Telehealth: Payer: Self-pay | Admitting: Oncology

## 2020-11-02 NOTE — Telephone Encounter (Signed)
Called patient per message from [place]. Patient called in for an appt.   Left message on patients vmail to call back at [phone] to set up an appointment.

## 2020-11-26 ENCOUNTER — Other Ambulatory Visit: Payer: Self-pay | Admitting: *Deleted

## 2020-11-26 DIAGNOSIS — C189 Malignant neoplasm of colon, unspecified: Secondary | ICD-10-CM

## 2020-11-29 ENCOUNTER — Inpatient Hospital Stay: Payer: Medicare HMO | Admitting: Oncology

## 2020-11-29 ENCOUNTER — Other Ambulatory Visit: Payer: Self-pay

## 2020-11-29 ENCOUNTER — Inpatient Hospital Stay: Payer: Medicare HMO | Attending: Oncology

## 2020-11-29 VITALS — BP 160/80 | HR 84 | Temp 97.8°F | Resp 18 | Ht 62.0 in | Wt 144.8 lb

## 2020-11-29 DIAGNOSIS — C20 Malignant neoplasm of rectum: Secondary | ICD-10-CM | POA: Diagnosis not present

## 2020-11-29 DIAGNOSIS — C189 Malignant neoplasm of colon, unspecified: Secondary | ICD-10-CM

## 2020-11-29 DIAGNOSIS — Z923 Personal history of irradiation: Secondary | ICD-10-CM | POA: Insufficient documentation

## 2020-11-29 DIAGNOSIS — D0511 Intraductal carcinoma in situ of right breast: Secondary | ICD-10-CM | POA: Diagnosis present

## 2020-11-29 LAB — CEA (ACCESS): CEA (CHCC): 4.05 ng/mL (ref 0.00–5.00)

## 2020-11-29 NOTE — Progress Notes (Signed)
[place] OFFICE PROGRESS NOTE   Diagnosis: Breast cancer, rectal cancer  INTERVAL HISTORY:   Heather Rasmussen returns as scheduled.  She continues Arimidex.  Mild hot flashes.  No change at the right breast.  She reports intermittent fecal incontinence since undergoing colon surgery.  No new complaint.  She is scheduled for mammogram next week.  Objective:  Vital signs in last 24 hours:  Blood pressure (!) 160/80, pulse 84, temperature 97.8 F (36.6 C), temperature source Oral, resp. rate 18, height '5\' 2"'$  (1.575 m), weight 144 lb 12.8 oz (65.7 kg), SpO2 99 %.    Lymphatics: No cervical, supraclavicular, axillary, or inguinal nodes Resp: Lungs clear bilaterally Cardio: Regular rate and rhythm GI: No hepatosplenomegaly, no mass, nontender Vascular: No leg edema Breast: No mass in either breast.  Firm tissue at the medial aspect of the right lumpectomy scar.  Radiation hyperpigmentation at the right breast.  Lab Results:  Lab Results  Component Value Date   WBC 4.7 10/24/2012   HGB 14.2 10/24/2012   HCT 41.7 10/24/2012   MCV 100.3 10/24/2012   PLT 176 10/24/2012   NEUTROABS 3.3 10/24/2012    CMP  Lab Results  Component Value Date   NA 141 10/24/2012   K 4.2 10/24/2012   CL 110 (H) 01/15/2012   CO2 22 10/24/2012   GLUCOSE 72 10/24/2012   BUN 17.7 10/24/2012   CREATININE 1.1 10/24/2012   CALCIUM 9.9 10/24/2012   PROT 8.1 10/24/2012   ALBUMIN 3.9 10/24/2012   AST 46 (H) 10/24/2012   ALT 49 10/24/2012   ALKPHOS 106 10/24/2012   BILITOT 0.53 10/24/2012   GFRNONAA 50 (L) 01/04/2011   GFRAA 58 (L) 01/04/2011    Lab Results  Component Value Date   CEA1 4.30 08/22/2018   CEA 3.9 11/26/2014    Medications: I have reviewed the patient's current medications.   Assessment/Plan: Rectal cancer-stage IV, status post infusional 5-FU/radiation beginning 02/12/2006, status post a low anterior resection with loop ileostomy and liver biopsy on 05/02/2006.    2. Liver resection at [facility] 06/21/2006.   3. Loop ileostomy closure November 2008.   4. 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   5. Fecal incontinence, chronic since surgery 6. Screening colonoscopy 01/07/2014. Diminutive polyp found in the rectum. Some bleeding at the anastomosis. Diminutive polyp in the proximal ascending colon. Pathology on rectal polyp showed mucosal prolapse-type polyp. No dysplasia or malignancy identified. Next colonoscopy at a five-year interval. 7.  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  No abnormal right axillary lymph nodes. 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  No invasive tumor identified. Right lateral breast excisional biopsy 10/22/2018- ductal carcinoma in situ, high nuclear grade with central necrosis and associated microcalcifications.  Tumor less than 1 mm from the anterior margin.  Changes consistent with previous biopsy site.  Excisional biopsy additional lateral anterior-no evidence of malignancy.  ER positive (greater than 95%), PR +90% Right breast radiation 11/25/2018 -12/18/2018 Adjuvant Arimidex beginning 01/16/2019  8.  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  She will continue Arimidex.  She is scheduled for a mammogram within the next few weeks.  She is due for  a screening colonoscopy.  I made a referral to Dr. Collene Mares.  Heather Rasmussen will return for an office visit in 1 year.  Betsy Coder, MD  11/29/2020  12:09 PM

## 2020-11-30 ENCOUNTER — Other Ambulatory Visit: Payer: Self-pay | Admitting: *Deleted

## 2020-11-30 ENCOUNTER — Telehealth: Payer: Self-pay | Admitting: *Deleted

## 2020-11-30 DIAGNOSIS — D0511 Intraductal carcinoma in situ of right breast: Secondary | ICD-10-CM

## 2020-11-30 LAB — CEA (IN HOUSE-CHCC): CEA (CHCC-In House): 3.79 ng/mL (ref 0.00–5.00)

## 2020-11-30 MED ORDER — ANASTROZOLE 1 MG PO TABS: 1.0000 mg | ORAL_TABLET | Freq: Every day | ORAL | 11 refills | Status: AC

## 2020-11-30 NOTE — Telephone Encounter (Signed)
Notified patient of normal CEA results and that her anastrozole has been sent to her pharmacy and referral to Dr. Collene Mares for colonoscopy has been sent as well.

## 2020-11-30 NOTE — Telephone Encounter (Signed)
-----   Message from Ladell Pier, MD sent at 11/29/2020  5:03 PM EDT ----- Please call patient, CEA is normal, follow-up as scheduled

## 2020-12-01 ENCOUNTER — Encounter: Payer: Self-pay | Admitting: *Deleted

## 2020-12-01 ENCOUNTER — Telehealth: Payer: Self-pay

## 2020-12-01 NOTE — Telephone Encounter (Signed)
-----   Message from Ladell Pier, MD sent at 11/30/2020  2:50 PM EDT ----- Please call patient, CEA is normal, follow-up as scheduled

## 2020-12-01 NOTE — Telephone Encounter (Signed)
Message relayed to Pt about normal CEA and follow up. Pt verbalized understanding.

## 2020-12-01 NOTE — Progress Notes (Signed)
Faxed referral order, demographics and records to Dr. Juanita Craver [phone] for colonoscopy.

## 2020-12-28 ENCOUNTER — Telehealth: Payer: Self-pay | Admitting: *Deleted

## 2020-12-28 NOTE — Telephone Encounter (Signed)
Per Dr. Lorie Apley office, they have called patient x 2 with no return call.  Spoke w/patient and she agrees she will reach out to them when her car is repaired ($700). Also needs to find out what her deposit/out of pocket is for colonoscopy. Her mammogram co-pay is $130 and she also needs cataract surgery. Provided emotional support and encouraged her to call GI when things settle down.

## 2021-01-13 ENCOUNTER — Telehealth: Payer: Self-pay

## 2021-02-21 ENCOUNTER — Telehealth: Payer: Self-pay | Admitting: *Deleted

## 2021-02-21 NOTE — Telephone Encounter (Signed)
Called patient to f/u on referral made on 12/02/20 for Dr. Collene Mares for colonoscopy. She reports they did call her and she is planning on having it in January.

## 2021-11-28 ENCOUNTER — Inpatient Hospital Stay: Payer: Medicare HMO | Admitting: Oncology

## 2021-12-26 ENCOUNTER — Inpatient Hospital Stay: Payer: Medicare HMO | Attending: Oncology | Admitting: Oncology

## 2021-12-26 VITALS — BP 165/90 | HR 91 | Temp 98.2°F | Resp 18 | Ht 62.0 in | Wt 139.0 lb

## 2021-12-26 DIAGNOSIS — D0511 Intraductal carcinoma in situ of right breast: Secondary | ICD-10-CM | POA: Diagnosis not present

## 2021-12-26 DIAGNOSIS — C20 Malignant neoplasm of rectum: Secondary | ICD-10-CM | POA: Insufficient documentation

## 2021-12-26 DIAGNOSIS — Z923 Personal history of irradiation: Secondary | ICD-10-CM | POA: Insufficient documentation

## 2021-12-26 DIAGNOSIS — C189 Malignant neoplasm of colon, unspecified: Secondary | ICD-10-CM | POA: Diagnosis not present

## 2021-12-26 DIAGNOSIS — C787 Secondary malignant neoplasm of liver and intrahepatic bile duct: Secondary | ICD-10-CM | POA: Diagnosis not present

## 2021-12-26 NOTE — Progress Notes (Signed)
[place] OFFICE PROGRESS NOTE   Diagnosis: Rectal cancer, DCIS  INTERVAL HISTORY:   Heather Rasmussen returns as scheduled.  She continues anastrozole.  She has flashes that predated anastrozole.  No arthralgias.  She reports pain in the left hip and leg for the past 3 weeks.  The pain is present at the inner aspect of the left lower leg, left thigh, and left buttock.  She is scheduled to see orthopedics later today. She saw Dr. Collene Mares and reports a colonoscopy will be scheduled after the leg pain is addressed.  A bilateral mammogram 12/12/2021 revealed a stable right breast lumpectomy.  An 8 mm group of punctate calcifications in the medial left breast is indeterminate.  She is scheduled for a biopsy next week.  No palpable change in either breast.  Good appetite.  No difficulty with bowel function. Objective:  Vital signs in last 24 hours:  Blood pressure (!) 165/90, pulse 91, temperature 98.2 F (36.8 C), temperature source Oral, resp. rate 18, height '5\' 2"'$  (1.575 m), weight 139 lb (63 kg), SpO2 100 %.    Lymphatics: No cervical, supraclavicular, axillary, or inguinal nodes Resp: Lungs clear bilaterally Cardio: Regular rate and rhythm GI: No hepatosplenomegaly Vascular: No leg edema or erythema Breast: Firm tissue the right lumpectomy scar.  No mass in either breast.  Radiation hyperpigmentation of the right breast. Musculoskeletal: Pain with internal/external rotation of the left hip.  No pain with palpation at the left buttock or trochanter.  Lab Results:  Lab Results  Component Value Date   WBC 4.7 10/24/2012   HGB 14.2 10/24/2012   HCT 41.7 10/24/2012   MCV 100.3 10/24/2012   PLT 176 10/24/2012   NEUTROABS 3.3 10/24/2012    CMP  Lab Results  Component Value Date   NA 141 10/24/2012   K 4.2 10/24/2012   CL 110 (H) 01/15/2012   CO2 22 10/24/2012   GLUCOSE 72 10/24/2012   BUN 17.7 10/24/2012   CREATININE 1.1 10/24/2012   CALCIUM 9.9 10/24/2012   PROT  8.1 10/24/2012   ALBUMIN 3.9 10/24/2012   AST 46 (H) 10/24/2012   ALT 49 10/24/2012   ALKPHOS 106 10/24/2012   BILITOT 0.53 10/24/2012   GFRNONAA 50 (L) 01/04/2011   GFRAA 58 (L) 01/04/2011    Lab Results  Component Value Date   CEA1 3.79 11/29/2020   CEA 4.05 11/29/2020    Medications: I have reviewed the patient's current medications.   Assessment/Plan: Rectal cancer-stage IV, status post infusional 5-FU/radiation beginning 02/12/2006, status post a low anterior resection with loop ileostomy and liver biopsy on 05/02/2006.   2. Liver resection at [facility] 06/21/2006.   3. Loop ileostomy closure November 2008.   4. 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   5. Fecal incontinence, chronic since surgery 6. Screening colonoscopy 01/07/2014. Diminutive polyp found in the rectum. Some bleeding at the anastomosis. Diminutive polyp in the proximal ascending colon. Pathology on rectal polyp showed mucosal prolapse-type polyp. No dysplasia or malignancy identified. Next colonoscopy at a five-year interval. 7.  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  No abnormal right axillary lymph nodes. 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  No invasive tumor identified. Right lateral breast excisional biopsy 10/22/2018- ductal carcinoma in situ, high nuclear grade with  central necrosis and associated microcalcifications.  Tumor less than 1 mm from the anterior margin.  Changes consistent with previous biopsy site.  Excisional biopsy additional lateral anterior-no evidence of malignancy.  ER positive (greater than 95%), PR +90% Right breast radiation 11/25/2018 -12/18/2018 Adjuvant Arimidex beginning 01/16/2019  8.  Genetic testing-[employer]ae  panel 10/11/2018-heterozygous  APC  VUS      Disposition: Ms. Cederberg is in clinical remission from rectal cancer.  I encouraged her to follow-up with Dr. Collene Mares for a screening colonoscopy.  She will continue adjuvant Arimidex for DCIS.  She is scheduled for biopsy of the left breast after a recent gram revealed indeterminate calcifications.  She will return for an office visit in 1 year.  I am available to see her sooner pending the breast biopsy result and orthopedic evaluation.    Betsy Coder, MD  12/26/2021  1:10 PM

## 2021-12-26 NOTE — Patient Instructions (Signed)
Please follow up with your primary care physician for the refill on amlodipine

## 2022-10-02 ENCOUNTER — Emergency Department (HOSPITAL_BASED_OUTPATIENT_CLINIC_OR_DEPARTMENT_OTHER)
Admission: EM | Admit: 2022-10-02 | Discharge: 2022-10-02 | Disposition: A | Discharge status: Home / Self Care | Payer: Medicare HMO | Attending: Emergency Medicine | Admitting: Emergency Medicine

## 2022-10-02 ENCOUNTER — Other Ambulatory Visit: Payer: Self-pay

## 2022-10-02 DIAGNOSIS — Z79899 Other long term (current) drug therapy: Secondary | ICD-10-CM | POA: Diagnosis not present

## 2022-10-02 DIAGNOSIS — K0889 Other specified disorders of teeth and supporting structures: Secondary | ICD-10-CM | POA: Insufficient documentation

## 2022-10-02 DIAGNOSIS — I10 Essential (primary) hypertension: Secondary | ICD-10-CM | POA: Diagnosis not present

## 2022-10-02 MED ORDER — ACETAMINOPHEN 500 MG PO TABS
1000.0000 mg | ORAL_TABLET | Freq: Once | ORAL | Status: AC
  Administered 2022-10-02: 1000 mg via ORAL
  Filled 2022-10-02: qty 2

## 2022-10-02 MED ORDER — AMOXICILLIN-POT CLAVULANATE 875-125 MG PO TABS: 1.0000 | ORAL_TABLET | Freq: Two times a day (BID) | ORAL | 0 refills | Status: AC

## 2022-10-02 NOTE — ED Provider Notes (Signed)
[place] [facility] Provider Note   CSN: [id number] Arrival date & time: 10/02/22  4098     History  Chief Complaint  Patient presents with   Dental Pain    Heather Rasmussen is a 72 y.o. female.   Dental Pain 72 year old female history of GERD, hypertension presenting for dental pain.  She states she has left mandibular posterior molar pain for the last few days.  It initially was swollen but is actually improved.  Some subjective fevers.  No vomiting, no difficulty opening her mouth or eating.  She takes powder for pain.  She states she is on multiple blood pressure medications but did not take them that at this morning, she plans to take them is when she gets home.  She has not had any recent antibiotics.  No chest pain or difficulty breathing or abdominal pain.  No change in urination.     Home Medications Prior to Admission medications   Medication Sig Start Date End Date Taking? Authorizing Provider  amoxicillin-clavulanate (AUGMENTIN) 875-125 MG tablet Take 1 tablet by mouth every 12 (twelve) hours for 7 days. 10/02/22 10/09/22 Yes Laurence Spates, MD  amLODipine (NORVASC) 5 MG tablet Take 5 mg by mouth daily.  Patient not taking: Reported on 12/26/2021    [provider]  anastrozole (ARIMIDEX) 1 MG tablet Take 1 tablet (1 mg total) by mouth daily. 11/30/20   Ladene Artist, MD  Calcium Carb-Cholecalciferol (CALCIUM+D3) 600-800 MG-UNIT TABS Take 1 tablet by mouth daily. 01/16/19   Ladene Artist, MD  cyanocobalamin (VITAMIN B12) 1000 MCG tablet Take 1,000 mcg by mouth daily.    [provider]  docusate sodium (COLACE) 100 MG capsule Take 100 mg by mouth daily.    [provider]  lisinopril-hydrochlorothiazide (ZESTORETIC) 20-12.5 MG tablet Take 2 tablets by mouth daily. 11/22/20   [provider]  metoprolol succinate (TOPROL-XL) 50 MG 24 hr tablet Take 100 mg by mouth.  07/05/16 12/26/21  [provider]  pravastatin (PRAVACHOL) 40 MG tablet Take 40 mg by mouth daily. 11/22/20   [provider]  VITAMIN D, CHOLECALCIFEROL, PO Take 50,000 Units by mouth once a week.    [provider]      Allergies    Amlodipine, Mometasone, and Morphine    Review of Systems   Review of Systems Review of systems completed and notable as per HPI.  ROS otherwise negative.   Physical Exam Updated Vital Signs BP (!) 202/95 (BP Location: Left Arm)   Pulse 84   Temp 98 F (36.7 C) (Oral)   Resp 19   Ht 5\' 2"  (1.575 m)   Wt 62.1 kg   SpO2 98%   BMI 25.06 kg/m  Physical Exam Vitals and nursing note reviewed.  Constitutional:      General: She is not in acute distress.    Appearance: She is well-developed.  HENT:     Head: Normocephalic and atraumatic.     Mouth/Throat:     Mouth: Mucous membranes are moist.     Pharynx: Oropharynx is clear.     Comments: Poor dentition.  Cavity present to left mandibular posterior molar.  Some tenderness along the left mandibular gumline but no significant swelling, erythema, fluctuance.  She has no trismus or facial swelling or skin changes. Eyes:     Conjunctiva/sclera: Conjunctivae normal.  Cardiovascular:     Rate and Rhythm: Normal rate and regular rhythm.  Heart sounds: No murmur heard. Pulmonary:     Effort: Pulmonary effort is normal. No respiratory distress.     Breath sounds: Normal breath sounds.  Abdominal:     Palpations: Abdomen is soft.     Tenderness: There is no abdominal tenderness.  Musculoskeletal:        General: No swelling.     Cervical back: Normal range of motion and neck supple. No rigidity or tenderness.  Lymphadenopathy:     Cervical: No cervical adenopathy.  Skin:    General: Skin is warm and dry.     Capillary Refill: Capillary refill takes less than 2 seconds.  Neurological:     Mental Status: She is alert.  Psychiatric:        Mood and Affect: Mood normal.     ED Results /  Procedures / Treatments   Labs (all labs ordered are listed, but only abnormal results are displayed) Labs Reviewed - No data to display  EKG None  Radiology No results found.  Procedures Procedures    Medications Ordered in ED Medications  acetaminophen (TYLENOL) tablet 1,000 mg (has no administration in time range)    ED Course/ Medical Decision Making/ A&P                             Medical Decision Making  Medical Decision Making:   Heather Rasmussen is a 72 y.o. female who presented to the ED today with dental pain.  Vital signs notable for hypertension, likely related to pain and the fact that she has not taken her medications today.  She has no chest pain, shortness of breath or other signs or symptoms of hypertensive emergency or urgency, no further workup indicated at this time for hypertension.  She has poor dentition and likely [facility] of the painful tooth.  She has no signs of abscess, cellulitis, PTA, RPA, Ludwig's angina.  She is not systemically ill or febrile.  Will treat for possible pulpitis with antibiotics and recommend close follow-up with PCP for blood pressure and dentistry.  She is comfortable this plan.  Given strict return precautions for new or worsening symptoms.  Patient's presentation is most consistent with acute, uncomplicated illness.           Final Clinical Impression(s) / ED Diagnoses Final diagnoses:  Pain, dental    Rx / DC Orders ED Discharge Orders          Ordered    amoxicillin-clavulanate (AUGMENTIN) 875-125 MG tablet  Every 12 hours        10/02/22 0947              Laurence Spates, MD 10/02/22 [phone]

## 2022-10-02 NOTE — Discharge Instructions (Signed)
You have a possible infection of your tooth.  You need to call your dentist today to schedule follow-up within the next 2 to 3 days.  You are being placed on antibiotics and should complete the entire course.  If you develop severe pain, worsening swelling, difficulty opening her mouth or swallowing or persistent fever you should return to the ED.

## 2022-10-02 NOTE — ED Triage Notes (Signed)
Patient presents to ED via POV from home. Here with left lower dental pain. Patient reports known "bad teeth".

## 2022-12-25 ENCOUNTER — Inpatient Hospital Stay: Payer: Medicare HMO | Admitting: Oncology

## 2023-02-02 ENCOUNTER — Inpatient Hospital Stay: Payer: Medicare HMO | Admitting: Oncology

## 2023-02-19 ENCOUNTER — Inpatient Hospital Stay: Payer: Medicare HMO | Attending: Oncology | Admitting: Oncology

## 2023-02-19 VITALS — BP 168/77 | HR 74 | Temp 98.1°F | Resp 18 | Ht 62.0 in | Wt 144.8 lb

## 2023-02-19 DIAGNOSIS — D0511 Intraductal carcinoma in situ of right breast: Secondary | ICD-10-CM | POA: Insufficient documentation

## 2023-02-19 DIAGNOSIS — C20 Malignant neoplasm of rectum: Secondary | ICD-10-CM | POA: Diagnosis not present

## 2023-02-19 DIAGNOSIS — C189 Malignant neoplasm of colon, unspecified: Secondary | ICD-10-CM | POA: Diagnosis not present

## 2023-02-19 DIAGNOSIS — Z923 Personal history of irradiation: Secondary | ICD-10-CM | POA: Diagnosis not present

## 2023-02-19 NOTE — Progress Notes (Signed)
[facility] OFFICE PROGRESS NOTE   Diagnosis: DCIS, rectal cancer  INTERVAL HISTORY:   Heather Rasmussen turns as scheduled.  She continues anastrozole.  No change of either breast.  No difficulty with bowel function.  She reports undergoing colonoscopy in October. She underwent a Uritact left breast biopsy of microcalcifications on 12/30/2021.  The pathology revealed no evidence of malignancy. A mammogram 01/01/2023 revealed no evidence of malignancy.  A bone density scan 06/20/2022 was normal.  He reports recent "soreness "at the left submandibular area treated with antibiotics for a tooth infection.  This has improved. Objective:  Vital signs in last 24 hours:  Blood pressure (!) 168/77, pulse 74, temperature 98.1 F (36.7 C), temperature source Temporal, resp. rate 18, height 5\' 2"  (1.575 m), weight 144 lb 12.8 oz (65.7 kg), SpO2 100%.    HEENT: Neck without mass, the submandibular glands are symmetric.  Oropharynx without visible mass. Lymphatics: No cervical, supraclavicular, axillary, or inguinal nodes Resp: Lungs clear bilaterally Cardio: Regular rate and rhythm GI: No hepatosplenomegaly, no mass, nontender Vascular: No leg edema Breasts: Firm tissue surrounding the right lumpectomy scar.  No mass in either breast.  Lab Results:  Lab Results  Component Value Date   WBC 4.7 10/24/2012   HGB 14.2 10/24/2012   HCT 41.7 10/24/2012   MCV 100.3 10/24/2012   PLT 176 10/24/2012   NEUTROABS 3.3 10/24/2012    CMP  Lab Results  Component Value Date   NA 141 10/24/2012   K 4.2 10/24/2012   CL 110 (H) 01/15/2012   CO2 22 10/24/2012   GLUCOSE 72 10/24/2012   BUN 17.7 10/24/2012   CREATININE 1.1 10/24/2012   CALCIUM 9.9 10/24/2012   PROT 8.1 10/24/2012   ALBUMIN 3.9 10/24/2012   AST 46 (H) 10/24/2012   ALT 49 10/24/2012   ALKPHOS 106 10/24/2012   BILITOT 0.53 10/24/2012   GFRNONAA 50 (L) 01/04/2011   GFRAA 58 (L) 01/04/2011    Lab Results  Component  Value Date   CEA1 3.79 11/29/2020   CEA 4.05 11/29/2020     Medications: I have reviewed the patient's current medications.   Assessment/Plan: Rectal cancer-stage IV, status post infusional 5-FU/radiation beginning 02/12/2006, status post a low anterior resection with loop ileostomy and liver biopsy on 05/02/2006.   2. Liver resection at [facility] 06/21/2006.   3. Loop ileostomy closure November 2008.   4. 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 Abbey Chatters December 2012.   5. Fecal incontinence, chronic since surgery 6. Screening colonoscopy 01/07/2014. Diminutive polyp found in the rectum. Some bleeding at the anastomosis. Diminutive polyp in the proximal ascending colon. Pathology on rectal polyp showed mucosal prolapse-type polyp. No dysplasia or malignancy identified. Next colonoscopy at a five-year interval. 7.  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  No abnormal right axillary lymph nodes. 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  No invasive tumor identified. Right lateral breast excisional biopsy 10/22/2018- ductal carcinoma in situ, high nuclear grade with central necrosis and associated microcalcifications.  Tumor less than 1 mm from the anterior margin.  Changes consistent with previous biopsy site.  Excisional biopsy additional lateral anterior-no evidence of malignancy.  ER positive (greater than 95%), PR +90% Right breast radiation 11/25/2018 -12/18/2018 Adjuvant Arimidex beginning 01/16/2019  8.  Genetic testing-[employer] panel 10/11/2018-heterozygous  APC  VUS       Disposition: Heather Rasmussen remains in clinical remission from rectal cancer and right DCIS.  She will complete a 5-year course of adjuvant anastrozole in 1 year.  She will  continue yearly mammography.  She will return for an office visit in 1 year. She will continue colonoscopy surveillance with Dr. Loreta Ave.  Thornton Papas, MD  02/19/2023  11:16 AM

## 2024-02-19 ENCOUNTER — Ambulatory Visit: Payer: Medicare HMO | Attending: Oncology | Admitting: Oncology

## 2024-02-19 VITALS — BP 135/67 | HR 66 | Temp 97.8°F | Resp 18 | Ht 62.0 in | Wt 146.2 lb

## 2024-02-19 DIAGNOSIS — Z86000 Personal history of in-situ neoplasm of breast: Secondary | ICD-10-CM | POA: Diagnosis not present

## 2024-02-19 DIAGNOSIS — N649 Disorder of breast, unspecified: Secondary | ICD-10-CM | POA: Diagnosis not present

## 2024-02-19 DIAGNOSIS — Z08 Encounter for follow-up examination after completed treatment for malignant neoplasm: Secondary | ICD-10-CM | POA: Diagnosis present

## 2024-02-19 DIAGNOSIS — Z87891 Personal history of nicotine dependence: Secondary | ICD-10-CM | POA: Insufficient documentation

## 2024-02-19 DIAGNOSIS — C189 Malignant neoplasm of colon, unspecified: Secondary | ICD-10-CM

## 2024-02-19 DIAGNOSIS — Z85048 Personal history of other malignant neoplasm of rectum, rectosigmoid junction, and anus: Secondary | ICD-10-CM | POA: Insufficient documentation

## 2024-02-19 DIAGNOSIS — Z923 Personal history of irradiation: Secondary | ICD-10-CM | POA: Insufficient documentation

## 2024-02-19 NOTE — Progress Notes (Signed)
 [facility] OFFICE PROGRESS NOTE   Diagnosis: DCIS, rectal cancer  INTERVAL HISTORY:   Heather Rasmussen returns as scheduled.  She completed 5 years of anastrozole  last month.  She feels well.  No change over either breast.  No difficulty with bowel function.  No bleeding.  She reports undergoing a colonoscopy by Dr. Kristie last year. She had a get a mammogram 01/03/2024.  She is scheduled for cataract surgery this week.  She reports a raised lesion at the left breast surrounding a October 2023 biopsy site.  The raised area began approximately 8 months ago.  She saw dermatology and reports no specific treatment was recommended.  Objective:  Vital signs in last 24 hours:  Blood pressure 135/67, pulse 66, temperature 97.8 F (36.6 C), temperature source Temporal, resp. rate 18, height 5' 2 (1.575 m), weight 146 lb 3.2 oz (66.3 kg), SpO2 100%.   Lymphatics: No cervical, supraclavicular, axillary, or inguinal nodes Resp: Lungs clear bilaterally Cardio: Regular rate and rhythm GI: No hepatosplenomegaly, nontender, no mass Vascular: No leg edema Breast: Firm tissue surrounding the right lumpectomy scar.  No mass in either breast Skin: 2.5 cm oval raised pink/light brown lesion at the upper left breast   Lab Results:  Lab Results  Component Value Date   WBC 4.7 10/24/2012   HGB 14.2 10/24/2012   HCT 41.7 10/24/2012   MCV 100.3 10/24/2012   PLT 176 10/24/2012   NEUTROABS 3.3 10/24/2012    CMP  Lab Results  Component Value Date   NA 141 10/24/2012   K 4.2 10/24/2012   CL 110 (H) 01/15/2012   CO2 22 10/24/2012   GLUCOSE 72 10/24/2012   BUN 17.7 10/24/2012   CREATININE 1.1 10/24/2012   CALCIUM 9.9 10/24/2012   PROT 8.1 10/24/2012   ALBUMIN 3.9 10/24/2012   AST 46 (H) 10/24/2012   ALT 49 10/24/2012   ALKPHOS 106 10/24/2012   BILITOT 0.53 10/24/2012   GFRNONAA 50 (L) 01/04/2011   GFRAA 58 (L) 01/04/2011    Lab Results  Component Value Date   CEA1 3.79  11/29/2020   CEA 4.05 11/29/2020    Medications: I have reviewed the patient's current medications.   Assessment/Plan: Rectal cancer-stage IV, status post infusional 5-FU/radiation beginning 02/12/2006, status post a low anterior resection with loop ileostomy and liver biopsy on 05/02/2006.   2. Liver resection at [facility] 06/21/2006.   3. Loop ileostomy closure November 2008.   4. 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 Lily December 2012.   5. Fecal incontinence, chronic since surgery 6. Screening colonoscopy 01/07/2014. Diminutive polyp found in the rectum. Some bleeding at the anastomosis. Diminutive polyp in the proximal ascending colon. Pathology on rectal polyp showed mucosal prolapse-type polyp. No dysplasia or malignancy identified. Next colonoscopy at a five-year interval. 7.  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  No abnormal right axillary lymph nodes. 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  No invasive tumor identified. Right lateral breast excisional biopsy 10/22/2018- ductal carcinoma in situ, high nuclear grade with central necrosis and associated microcalcifications.  Tumor less than 1 mm from the anterior margin.  Changes consistent with previous biopsy site.  Excisional biopsy additional lateral anterior-no evidence of malignancy.  ER positive (greater than 95%), PR +90% Right breast radiation 11/25/2018 -12/18/2018 Adjuvant  Arimidex  beginning 01/16/2019-November 2025  8.  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  She completed 5 years of adjuvant anastrozole  for treatment of DCIS of the right breast.  She has developed a  raised oval lesion around the left breast biopsy site.  She reports this area has been present for proximately 8 months.  This may be keloid formation or atypical reaction to placement of the tissue marker.  The area appears smooth and rounded, I have a low suspicion for a malignant process.  She plans to follow-up with radiology to evaluate the area.  She reports she has seen a dermatologist.  She will return for an office visit and repeat examination of the left breast in 3 months.  Arley Hof, MD  02/19/2024  9:44 AM

## 2024-05-20 ENCOUNTER — Inpatient Hospital Stay: Admitting: Oncology
